# Patient Record
Sex: Female | Born: 1970 | Race: White | Hispanic: No | Marital: Married | Smoking: Former smoker
Health system: Southern US, Community
[De-identification: ages and names within clinical notes are randomized; demographics above are authoritative.]

## PROBLEM LIST (undated history)

## (undated) DIAGNOSIS — Z01419 Encounter for gynecological examination (general) (routine) without abnormal findings: Secondary | ICD-10-CM

## (undated) DIAGNOSIS — N809 Endometriosis, unspecified: Secondary | ICD-10-CM

## (undated) DIAGNOSIS — L578 Other skin changes due to chronic exposure to nonionizing radiation: Secondary | ICD-10-CM

## (undated) DIAGNOSIS — R002 Palpitations: Secondary | ICD-10-CM

## (undated) DIAGNOSIS — G709 Myoneural disorder, unspecified: Secondary | ICD-10-CM

## (undated) DIAGNOSIS — G35 Multiple sclerosis: Secondary | ICD-10-CM

## (undated) DIAGNOSIS — M545 Low back pain, unspecified: Secondary | ICD-10-CM

## (undated) DIAGNOSIS — J302 Other seasonal allergic rhinitis: Secondary | ICD-10-CM

## (undated) DIAGNOSIS — T7840XA Allergy, unspecified, initial encounter: Secondary | ICD-10-CM

## (undated) HISTORY — PX: KNEE ARTHROSCOPY: SUR90

## (undated) HISTORY — DX: Palpitations: R00.2

## (undated) HISTORY — DX: Endometriosis, unspecified: N80.9

## (undated) HISTORY — DX: Multiple sclerosis: G35

## (undated) HISTORY — DX: Myoneural disorder, unspecified: G70.9

## (undated) HISTORY — DX: Low back pain, unspecified: M54.50

## (undated) HISTORY — DX: Other skin changes due to chronic exposure to nonionizing radiation: L57.8

## (undated) HISTORY — DX: Encounter for gynecological examination (general) (routine) without abnormal findings: Z01.419

## (undated) HISTORY — PX: OTHER SURGICAL HISTORY: SHX169

## (undated) HISTORY — DX: Low back pain: M54.5

## (undated) HISTORY — DX: Allergy, unspecified, initial encounter: T78.40XA

---

## 2007-08-23 ENCOUNTER — Ambulatory Visit: Payer: Self-pay | Admitting: Internal Medicine

## 2007-08-23 DIAGNOSIS — J309 Allergic rhinitis, unspecified: Secondary | ICD-10-CM | POA: Insufficient documentation

## 2007-08-26 LAB — CONVERTED CEMR LAB
AST: 21 units/L (ref 0–37)
Alkaline Phosphatase: 28 units/L — ABNORMAL LOW (ref 39–117)
Basophils Relative: 1.5 % — ABNORMAL HIGH (ref 0.0–1.0)
Bilirubin, Direct: 0.1 mg/dL (ref 0.0–0.3)
CO2: 29 meq/L (ref 19–32)
Calcium: 9.1 mg/dL (ref 8.4–10.5)
Chloride: 106 meq/L (ref 96–112)
Creatinine, Ser: 0.7 mg/dL (ref 0.4–1.2)
Eosinophils Relative: 1.9 % (ref 0.0–5.0)
GFR calc non Af Amer: 101 mL/min
Glucose, Bld: 73 mg/dL (ref 70–99)
HCT: 40.7 % (ref 36.0–46.0)
Hemoglobin: 14 g/dL (ref 12.0–15.0)
Lymphocytes Relative: 33.7 % (ref 12.0–46.0)
MCHC: 34.3 g/dL (ref 30.0–36.0)
MCV: 89.5 fL (ref 78.0–100.0)
Platelets: 228 10*3/uL (ref 150–400)
RBC: 4.55 M/uL (ref 3.87–5.11)
RDW: 11.8 % (ref 11.5–14.6)
TSH: 1.98 microintl units/mL (ref 0.35–5.50)
Total Bilirubin: 0.6 mg/dL (ref 0.3–1.2)
Total Protein: 6.7 g/dL (ref 6.0–8.3)
WBC: 6.9 10*3/uL (ref 4.5–10.5)

## 2007-08-30 ENCOUNTER — Ambulatory Visit: Payer: Self-pay | Admitting: Internal Medicine

## 2007-08-30 DIAGNOSIS — R221 Localized swelling, mass and lump, neck: Secondary | ICD-10-CM

## 2007-08-30 DIAGNOSIS — R220 Localized swelling, mass and lump, head: Secondary | ICD-10-CM | POA: Insufficient documentation

## 2008-02-15 ENCOUNTER — Telehealth: Payer: Self-pay | Admitting: Family Medicine

## 2008-02-15 ENCOUNTER — Emergency Department (HOSPITAL_COMMUNITY): Admission: EM | Admit: 2008-02-15 | Discharge: 2008-02-15 | Payer: Self-pay | Admitting: Family Medicine

## 2008-02-20 ENCOUNTER — Ambulatory Visit: Payer: Self-pay | Admitting: Internal Medicine

## 2008-02-20 DIAGNOSIS — R079 Chest pain, unspecified: Secondary | ICD-10-CM | POA: Insufficient documentation

## 2008-11-03 ENCOUNTER — Inpatient Hospital Stay (HOSPITAL_COMMUNITY): Admission: AD | Admit: 2008-11-03 | Discharge: 2008-11-05 | Payer: Self-pay | Admitting: Obstetrics and Gynecology

## 2009-01-05 ENCOUNTER — Ambulatory Visit: Payer: Self-pay | Admitting: Internal Medicine

## 2009-03-03 ENCOUNTER — Ambulatory Visit: Payer: Self-pay | Admitting: Internal Medicine

## 2009-08-04 ENCOUNTER — Ambulatory Visit: Payer: Self-pay | Admitting: Family Medicine

## 2009-08-04 DIAGNOSIS — S335XXA Sprain of ligaments of lumbar spine, initial encounter: Secondary | ICD-10-CM | POA: Insufficient documentation

## 2009-11-23 ENCOUNTER — Ambulatory Visit: Payer: Self-pay | Admitting: Family Medicine

## 2009-11-23 DIAGNOSIS — M545 Low back pain, unspecified: Secondary | ICD-10-CM | POA: Insufficient documentation

## 2009-11-25 ENCOUNTER — Encounter: Admission: RE | Admit: 2009-11-25 | Discharge: 2009-11-25 | Payer: Self-pay | Admitting: Family Medicine

## 2009-11-26 DIAGNOSIS — M5126 Other intervertebral disc displacement, lumbar region: Secondary | ICD-10-CM | POA: Insufficient documentation

## 2009-12-02 ENCOUNTER — Encounter: Payer: Self-pay | Admitting: Family Medicine

## 2010-04-26 ENCOUNTER — Ambulatory Visit: Payer: Self-pay | Admitting: Family Medicine

## 2010-04-28 LAB — CONVERTED CEMR LAB
AST: 21 units/L (ref 0–37)
Albumin: 4.1 g/dL (ref 3.5–5.2)
Alkaline Phosphatase: 31 units/L — ABNORMAL LOW (ref 39–117)
BUN: 12 mg/dL (ref 6–23)
Basophils Absolute: 0 10*3/uL (ref 0.0–0.1)
Basophils Relative: 0.7 % (ref 0.0–3.0)
Bilirubin, Direct: 0.1 mg/dL (ref 0.0–0.3)
CO2: 26 meq/L (ref 19–32)
Chloride: 106 meq/L (ref 96–112)
Creatinine, Ser: 0.6 mg/dL (ref 0.4–1.2)
Eosinophils Absolute: 0.1 10*3/uL (ref 0.0–0.7)
GFR calc non Af Amer: 110.05 mL/min (ref >60)
HDL: 52.4 mg/dL (ref >39.00)
LDL Cholesterol: 128 mg/dL — ABNORMAL HIGH (ref 0–99)
Lymphocytes Relative: 37.1 % (ref 12.0–46.0)
MCV: 90.3 fL (ref 78.0–100.0)
Neutro Abs: 2.6 10*3/uL (ref 1.4–7.7)
Neutrophils Relative %: 54.2 % (ref 43.0–77.0)
Platelets: 186 10*3/uL (ref 150.0–400.0)
RBC: 4.37 M/uL (ref 3.87–5.11)
TSH: 1.8 microintl units/mL (ref 0.35–5.50)
Total Bilirubin: 0.4 mg/dL (ref 0.3–1.2)
WBC: 4.8 10*3/uL (ref 4.5–10.5)

## 2010-05-09 ENCOUNTER — Ambulatory Visit: Payer: Self-pay | Admitting: Family Medicine

## 2010-05-09 DIAGNOSIS — L989 Disorder of the skin and subcutaneous tissue, unspecified: Secondary | ICD-10-CM | POA: Insufficient documentation

## 2010-05-09 DIAGNOSIS — R599 Enlarged lymph nodes, unspecified: Secondary | ICD-10-CM | POA: Insufficient documentation

## 2010-05-10 ENCOUNTER — Encounter
Admission: RE | Admit: 2010-05-10 | Discharge: 2010-05-10 | Payer: Self-pay | Source: Home / Self Care | Attending: Neurosurgery | Admitting: Neurosurgery

## 2010-05-27 ENCOUNTER — Ambulatory Visit: Payer: Self-pay | Admitting: Family Medicine

## 2010-06-06 ENCOUNTER — Ambulatory Visit
Admission: RE | Admit: 2010-06-06 | Discharge: 2010-06-06 | Payer: Self-pay | Source: Home / Self Care | Attending: Family Medicine | Admitting: Family Medicine

## 2010-06-06 DIAGNOSIS — J019 Acute sinusitis, unspecified: Secondary | ICD-10-CM | POA: Insufficient documentation

## 2010-06-28 NOTE — Assessment & Plan Note (Signed)
Summary: follow up on back pain/pt switched drs per dr ok/to est/cjr   Vital Signs:  Patient profile:   40 year old female Weight:      128 pounds BMI:     23.88 BP sitting:   88 / 60  (left arm) Cuff size:   regular  Vitals Entered By: Raechel Ache, RN (November 23, 2009 9:55 AM) CC: F/u LBP, still hurts.   History of Present Illness: Here for continued low back pain. She was here in March after she acutely hurt her back, and we treated her with Vicodin, Flexeril, and Prednisone. This severe pain resolved, but she has had stiffness and some low grade sharp pains ever since. She has dull pains in both anterior thighs as well. No weakness or numbness. Advil helps, and Lidoderm patches help. She borrowed some of these from a friend. She actually went to about 10 sessions of PT at [facility] in [place], but this did not help much.   Allergies: 1)  ! Erythromycin Base (Erythromycin Base) 2)  ! Avelox (Moxifloxacin Hcl)  Past History:  Past Medical History: Reviewed history from 08/23/2007 and no changes required. Allergic rhinitis  Past Surgical History: Reviewed history from 08/23/2007 and no changes required. arthroscopy cervical dysplasia  Review of Systems  The patient denies anorexia, fever, weight loss, weight gain, vision loss, decreased hearing, hoarseness, chest pain, syncope, dyspnea on exertion, peripheral edema, prolonged cough, headaches, hemoptysis, abdominal pain, melena, hematochezia, severe indigestion/heartburn, hematuria, incontinence, genital sores, muscle weakness, suspicious skin lesions, transient blindness, difficulty walking, depression, unusual weight change, abnormal bleeding, enlarged lymph nodes, angioedema, breast masses, and testicular masses.    Physical Exam  General:  Well-developed,well-nourished,in no acute distress; alert,appropriate and cooperative throughout examination Msk:  tender in the right lower back and over the  sciatic notch. No spasm. Full ROM. Negative SLR   Impression & Recommendations:  Problem # 1:  BACK PAIN, LUMBAR (ICD-724.2)  Her updated medication list for this problem includes:    Flexeril 10 Mg Tabs (Cyclobenzaprine hcl) .Marland Kitchen... Three times a day as needed spasm    Vicodin 5-500 Mg Tabs (Hydrocodone-acetaminophen) .Marland Kitchen... 1 q 4 hours as needed pain  Orders: Radiology Referral (Radiology)  Complete Medication List: 1)  Claritin 10 Mg Tabs (Loratadine) .... Once daily 2)  Multivitamins Tabs (Multiple vitamin) .... Once daily 3)  Vitamin C 500 Mg Chew (Ascorbic acid) .... Once daily 4)  Flexeril 10 Mg Tabs (Cyclobenzaprine hcl) .... Three times a day as needed spasm 5)  Vicodin 5-500 Mg Tabs (Hydrocodone-acetaminophen) .Marland Kitchen.. 1 q 4 hours as needed pain 6)  Lidoderm 5 % Ptch (Lidocaine) .... Apply for 12 hours a day as needed pain  Patient Instructions: 1)  Will set up an MRI soon. If this is unrevealing, we may refer her to a chiropractor Prescriptions: LIDODERM 5 % PTCH (LIDOCAINE) apply for 12 hours a day as needed pain  #30 x 5   Entered and Authorized by:   Nelwyn Salisbury MD   Signed by:   Nelwyn Salisbury MD on 11/23/2009   Method used:   [facility] to        [facility]  [address] [phone][facility] (retail[facility]       [address] [place], [place]  [address]       Ph: [phone] or [phone]       Fax: [phone]   RxID:  [id number]  

## 2010-06-28 NOTE — Letter (Signed)
Summary: [facility]  [facility]   Imported By: Maryln Gottron 01/03/2010 15:17:19  _____________________________________________________________________  External Attachment:    Type:   Image     Comment:   External Document

## 2010-06-28 NOTE — Assessment & Plan Note (Signed)
Summary: SW PT/BENT OVER/EXCRUC BACK PAIN/PS   Vital Signs:  Patient profile:   40 year old female Pulse rate:   74 / minute Pulse rhythm:   regular Resp:     12 per minute BP sitting:   102 / 74  (left arm) Cuff size:   regular  Vitals Entered By: Kern Reap CMA Duncan Dull) (August 04, 2009 3:07 PM)  Contraindications/Deferment of Procedures/Staging:    Test/Procedure: Weight Refused    Reason for deferment: patient declined-cannot calculate BMI  CC: back pain Is Patient Diabetic? No Pain Assessment Patient in pain? yes     Location: lower back Intensity: 6 Type: tight and painful   History of Present Illness: Here with her husband for the sudden onset of spasms and extreme pain in the lower back earlt this am. This started when she bent over to pull on a pair of pants. She has had this happen once before some years ago, but normally she has no back problems at all. The pain is centered in the lower back, and the pain does not radiate down the legs. No weakness or numbness in the legs. Using ice packs and Advil.   Allergies: 1)  ! Erythromycin Base (Erythromycin Base) 2)  ! Avelox (Moxifloxacin Hcl)  Past History:  Past Medical History: Reviewed history from 08/23/2007 and no changes required. Allergic rhinitis  Past Surgical History: Reviewed history from 08/23/2007 and no changes required. arthroscopy cervical dysplasia  Review of Systems  The patient denies anorexia, fever, weight loss, weight gain, vision loss, decreased hearing, hoarseness, chest pain, syncope, dyspnea on exertion, peripheral edema, prolonged cough, headaches, hemoptysis, abdominal pain, melena, hematochezia, severe indigestion/heartburn, hematuria, incontinence, genital sores, muscle weakness, suspicious skin lesions, transient blindness, difficulty walking, depression, unusual weight change, abnormal bleeding, enlarged lymph nodes, angioedema, breast masses, and testicular masses.    Physical  Exam  General:  in a lot of pain, seated in a wheelchair. we were able to assist her up onto the exam table Msk:  mildly tender in the lower back, both along the spine and into the paraspinal muscles. There is a lot of spasm, and her ROM is very limited.    Impression & Recommendations:  Problem # 1:  LUMBAR STRAIN, ACUTE (ICD-847.2)  Complete Medication List: 1)  Claritin 10 Mg Tabs (Loratadine) .... Once daily 2)  Multivitamins Tabs (Multiple vitamin) .... Once daily 3)  Vitamin C 500 Mg Chew (Ascorbic acid) .... Once daily 4)  Prednisone (pak) 10 Mg Tabs (Prednisone) .... As directed for 6 days 5)  Flexeril 10 Mg Tabs (Cyclobenzaprine hcl) .... Three times a day as needed spasm 6)  Vicodin 5-500 Mg Tabs (Hydrocodone-acetaminophen) .Marland Kitchen.. 1 q 4 hours as needed pain  Patient Instructions: 1)  Use a steroid dose pack, Flexeril, and Vicodin. Continue ice for another 24 hours, then switch to moist heat. Suggested she do gentle stretches to the lower back. Sleep on your side with some pillows between the legs. recheck if not any better in 2 days. Prescriptions: VICODIN 5-500 MG TABS (HYDROCODONE-ACETAMINOPHEN) 1 q 4 hours as needed pain  #60 x 0   Entered and Authorized by:   Nelwyn Salisbury MD   Signed by:   Nelwyn Salisbury MD on 08/04/2009   Method used:   Print then Give to Patient   RxID:   [id number] FLEXERIL 10 MG TABS (CYCLOBENZAPRINE HCL) three times a day as needed spasm  #60 x 5   Entered and Authorized by:  Nelwyn Salisbury MD   Signed by:   Nelwyn Salisbury MD on 08/04/2009   Method used:   Print then Give to Patient   RxID:   [id number] PREDNISONE (PAK) 10 MG TABS (PREDNISONE) as directed for 6 days  #1 x 0   Entered and Authorized by:   Nelwyn Salisbury MD   Signed by:   Nelwyn Salisbury MD on 08/04/2009   Method used:   Print then Give to Patient   RxID:   [id number]

## 2010-06-30 NOTE — Letter (Signed)
Summary: [facility]  [facility]   Imported By: Maryln Gottron 05/24/2010 11:31:11  _____________________________________________________________________  External Attachment:    Type:   Image     Comment:   External Document

## 2010-06-30 NOTE — Assessment & Plan Note (Signed)
Summary: cough/chest congestion/cjr   Vital Signs:  Patient profile:   40 year old female Weight:      128 pounds O2 Sat:      94 % Temp:     98.3 degrees F Pulse rate:   82 / minute BP sitting:   90 / 60  (left arm) Cuff size:   regular  Vitals Entered By: Pura Spice, RN (June 06, 2010 9:45 AM) CC: cough congestion  thinks now sinus inf. sputum dark color  stuffy nose    History of Present Illness: Here for one week of sinus pressure, PND, HA, and a dry cough. No fever.   Allergies: 1)  ! Erythromycin Base (Erythromycin Base) 2)  ! Avelox (Moxifloxacin Hcl) 3)  ! Biaxin  Past History:  Past Medical History: Reviewed history from 05/27/2010 and no changes required. Allergic rhinitis sees Dr. Marcelle Overlie for GYN exams Low back pain  Review of Systems  The patient denies anorexia, fever, weight loss, weight gain, vision loss, decreased hearing, hoarseness, chest pain, syncope, dyspnea on exertion, peripheral edema, hemoptysis, abdominal pain, melena, hematochezia, severe indigestion/heartburn, hematuria, incontinence, genital sores, muscle weakness, suspicious skin lesions, transient blindness, difficulty walking, depression, unusual weight change, abnormal bleeding, enlarged lymph nodes, angioedema, breast masses, and testicular masses.    Physical Exam  General:  Well-developed,well-nourished,in no acute distress; alert,appropriate and cooperative throughout examination Head:  Normocephalic and atraumatic without obvious abnormalities. No apparent alopecia or balding. Eyes:  No corneal or conjunctival inflammation noted. EOMI. Perrla. Funduscopic exam benign, without hemorrhages, exudates or papilledema. Vision grossly normal. Ears:  External ear exam shows no significant lesions or deformities.  Otoscopic examination reveals clear canals, tympanic membranes are intact bilaterally without bulging, retraction, inflammation or discharge. Hearing is grossly normal  bilaterally. Nose:  External nasal examination shows no deformity or inflammation. Nasal mucosa are pink and moist without lesions or exudates. Mouth:  Oral mucosa and oropharynx without lesions or exudates.  Teeth in good repair. Neck:  No deformities, masses, or tenderness noted. Lungs:  Normal respiratory effort, chest expands symmetrically. Lungs are clear to auscultation, no crackles or wheezes.   Impression & Recommendations:  Problem # 1:  ACUTE SINUSITIS, UNSPECIFIED (ICD-461.9)  Her updated medication list for this problem includes:    Augmentin 875-125 Mg Tabs (Amoxicillin-pot clavulanate) .Marland Kitchen..Marland Kitchen Two times a day  Complete Medication List: 1)  Claritin 10 Mg Tabs (Loratadine) .... Once daily 2)  Augmentin 875-125 Mg Tabs (Amoxicillin-pot clavulanate) .... Two times a day  Patient Instructions: 1)  Please schedule a follow-up appointment as needed .  Prescriptions: AUGMENTIN 875-125 MG TABS (AMOXICILLIN-POT CLAVULANATE) two times a day  #20 x 0   Entered and Authorized by:   Nelwyn Salisbury MD   Signed by:   Nelwyn Salisbury MD on 06/06/2010   Method used:   [facility] to        [facility]  [address] [phone][facility]ail[facility]       [address]       [place], [place]  [address]       Ph: [phone] or [phone]       Fax: [phone]   RxID:   [id number]    Orders Added: 1)  Est. Patient Level IV [56387]

## 2010-06-30 NOTE — Assessment & Plan Note (Signed)
Summary: hard lump near collar bone/cjr   Vital Signs:  Patient profile:   39 year old female Height:      61.5 inches Weight:      131 pounds Temp:     99.0 degrees F Pulse rate:   83 / minute BP sitting:   100 / 68  (left arm)  Vitals Entered By: Jeremy Johann CMA (May 09, 2010 3:15 PM) CC: swelling in neck x3weeks   History of Present Illness: Here to ask about two things that she just noticed a week or so ago. First she felt a tiny non-tender lump in the left side of her neck, and also she saw a tiny dark spot on the left chest area. Neither of these are symptomatic at all. She sees a dermatologist once a year for skin checks.   Current Medications (verified): 1)  Claritin 10 Mg  Tabs (Loratadine) .... Once Daily  Allergies (verified): 1)  ! Erythromycin Base (Erythromycin Base) 2)  ! Avelox (Moxifloxacin Hcl)  Past History:  Past Medical History: Reviewed history from 08/23/2007 and no changes required. Allergic rhinitis  Past Surgical History: Reviewed history from 08/23/2007 and no changes required. arthroscopy cervical dysplasia  Review of Systems  The patient denies anorexia, fever, weight loss, weight gain, vision loss, decreased hearing, hoarseness, chest pain, syncope, dyspnea on exertion, peripheral edema, prolonged cough, headaches, hemoptysis, abdominal pain, melena, hematochezia, severe indigestion/heartburn, hematuria, incontinence, genital sores, muscle weakness, suspicious skin lesions, transient blindness, difficulty walking, depression, unusual weight change, abnormal bleeding, enlarged lymph nodes, angioedema, breast masses, and testicular masses.    Physical Exam  General:  Well-developed,well-nourished,in no acute distress; alert,appropriate and cooperative throughout examination Head:  Normocephalic and atraumatic without obvious abnormalities. No apparent alopecia or balding. Eyes:  No corneal or conjunctival inflammation noted. EOMI.  Perrla. Funduscopic exam benign, without hemorrhages, exudates or papilledema. Vision grossly normal. Ears:  External ear exam shows no significant lesions or deformities.  Otoscopic examination reveals clear canals, tympanic membranes are intact bilaterally without bulging, retraction, inflammation or discharge. Hearing is grossly normal bilaterally. Nose:  External nasal examination shows no deformity or inflammation. Nasal mucosa are pink and moist without lesions or exudates. Mouth:  Oral mucosa and oropharynx without lesions or exudates.  Teeth in good repair. Neck:  single small mobile firm non-tender lump on the left lower neck Skin:  multiple freckles over the body, There is a single tiny  1mm dark papular lesion on the left chest just above the breast which is well marginated Axillary Nodes:  No palpable lymphadenopathy   Impression & Recommendations:  Problem # 1:  LYMPHADENOPATHY (ICD-785.6)  Problem # 2:  SKIN LESION (ICD-709.9)  Complete Medication List: 1)  Claritin 10 Mg Tabs (Loratadine) .... Once daily  Patient Instructions: 1)  reassured her that the left neck node is old, scarred down, and quite benign. Observe only. The lesion on the chest seems to be an early hemangioma, but we will watch this closely for any changes.    Orders Added: 1)  Est. Patient Level IV [54098]

## 2010-06-30 NOTE — Assessment & Plan Note (Signed)
Summary: CPX // RS   Vital Signs:  Patient profile:   40 year old female Height:      62 inches Weight:      127 pounds BMI:     23.31 O2 Sat:      96 % Temp:     97.7 degrees F Pulse rate:   72 / minute BP sitting:   102 / 60  (left arm)  Vitals Entered By: Pura Spice, RN (May 27, 2010 8:49 AM) CC: cpx no complaints   History of Present Illness: 40 yr old female for a cpx. She feels well in general. At our last visit we checked a tiny left [facility] lymph node, and she wants me to check it again. She had a lumbar ESI per Dr. Jeral Fruit, and this was very successful.   Allergies: 1)  ! Erythromycin Base (Erythromycin Base) 2)  ! Avelox (Moxifloxacin Hcl)  Past History:  Past Medical History: Allergic rhinitis sees Dr. Marcelle Overlie for GYN exams Low back pain  Past Surgical History: arthroscopy cervical ablation for dysplasia lumbar ESI 04-2010 per Dr. Jeral Fruit  Past History:  Care Management: Gynecology:  Dr Marcelle Overlie  Family History: Reviewed history from 08/23/2007 and no changes required. mother hyperthyroid---grave's dzs father---CAD/ CABG  Social History: Reviewed history from 08/23/2007 and no changes required. Occupation: Married Regular exercise-yes  Review of Systems  The patient denies anorexia, fever, weight loss, weight gain, vision loss, decreased hearing, hoarseness, chest pain, syncope, dyspnea on exertion, peripheral edema, prolonged cough, headaches, hemoptysis, abdominal pain, melena, hematochezia, severe indigestion/heartburn, hematuria, incontinence, genital sores, muscle weakness, suspicious skin lesions, transient blindness, difficulty walking, depression, unusual weight change, abnormal bleeding, enlarged lymph nodes, angioedema, breast masses, and testicular masses.    Physical Exam  General:  Well-developed,well-nourished,in no acute distress; alert,appropriate and cooperative throughout examination Head:  Normocephalic and atraumatic without  obvious abnormalities. No apparent alopecia or balding. Eyes:  No corneal or conjunctival inflammation noted. EOMI. Perrla. Funduscopic exam benign, without hemorrhages, exudates or papilledema. Vision grossly normal. Ears:  External ear exam shows no significant lesions or deformities.  Otoscopic examination reveals clear canals, tympanic membranes are intact bilaterally without bulging, retraction, inflammation or discharge. Hearing is grossly normal bilaterally. Nose:  External nasal examination shows no deformity or inflammation. Nasal mucosa are pink and moist without lesions or exudates. Mouth:  Oral mucosa and oropharynx without lesions or exudates.  Teeth in good repair. Neck:  No deformities, masses, or tenderness noted. Chest Wall:  No deformities, masses, or tenderness noted. Lungs:  Normal respiratory effort, chest expands symmetrically. Lungs are clear to auscultation, no crackles or wheezes. Heart:  Normal rate and regular rhythm. S1 and S2 normal without gallop, murmur, click, rub or other extra sounds. Abdomen:  Bowel sounds positive,abdomen soft and non-tender without masses, organomegaly or hernias noted. Msk:  No deformity or scoliosis noted of thoracic or lumbar spine.   Pulses:  R and L carotid,radial,femoral,dorsalis pedis and posterior tibial pulses are full and equal bilaterally Extremities:  No clubbing, cyanosis, edema, or deformity noted with normal full range of motion of all joints.   Neurologic:  No cranial nerve deficits noted. Station and gait are normal. Plantar reflexes are down-going bilaterally. DTRs are symmetrical throughout. Sensory, motor and coordinative functions appear intact. Skin:  Intact without suspicious lesions or rashes Cervical Nodes:  No lymphadenopathy noted Axillary Nodes:  No palpable lymphadenopathy Inguinal Nodes:  No significant adenopathy Psych:  Cognition and judgment appear intact. Alert and cooperative with normal  attention span and  concentration. No apparent delusions, illusions, hallucinations   Impression & Recommendations:  Problem # 1:  PREVENTIVE HEALTH CARE (ICD-V70.0)  Complete Medication List: 1)  Claritin 10 Mg Tabs (Loratadine) .... Once daily  Patient Instructions: 1)  I cannot find this neck node today at all.  2)  Please schedule a follow-up appointment as needed .    Orders Added: 1)  Est. Patient 18-39 years [[id number]]

## 2010-07-07 ENCOUNTER — Encounter: Payer: Self-pay | Admitting: Family Medicine

## 2010-07-13 ENCOUNTER — Encounter: Payer: Self-pay | Admitting: Family Medicine

## 2010-07-13 ENCOUNTER — Ambulatory Visit (INDEPENDENT_AMBULATORY_CARE_PROVIDER_SITE_OTHER): Payer: BC Managed Care – PPO | Admitting: Family Medicine

## 2010-07-13 VITALS — BP 82/60 | HR 109 | Temp 98.7°F | Ht 62.5 in | Wt 127.0 lb

## 2010-07-13 DIAGNOSIS — R599 Enlarged lymph nodes, unspecified: Secondary | ICD-10-CM

## 2010-07-13 DIAGNOSIS — R590 Localized enlarged lymph nodes: Secondary | ICD-10-CM

## 2010-07-13 NOTE — Progress Notes (Signed)
  Subjective:    Patient ID: [name], female    DOB: 07/04/1970, 40 y.o.   MRN: [id number]  HPI Here to re-evaluate several tiny lymph nodes in the left anterior neck that have been present for several months. We first evaluated these about 2 months ago, and we felt these were benign. She says they have not grown any larger but she worries about them all the time. Sometimes she thinks she can feel a slight swelling sensation or an itching sensation in this area, but she cannot be sure. She wonders if these feelings are "all in my head". No systemic symptoms, no weight changes, etc.    Review of Systems  Constitutional: Negative.   HENT: Negative.   Respiratory: Negative.   Cardiovascular: Negative.   Gastrointestinal: Negative.        Objective:   Physical Exam  Constitutional: She appears well-developed and well-nourished.  HENT:  Head: Normocephalic and atraumatic.  Right Ear: External ear normal.  Left Ear: External ear normal.  Nose: Nose normal.  Mouth/Throat: Oropharynx is clear and moist. No oropharyngeal exudate.  Eyes: Conjunctivae are normal. Right eye exhibits no discharge. Left eye exhibits no discharge.  Neck: Normal range of motion. Neck supple. No thyromegaly present.       She has 2 tiny mobile non-tender nodes in the left anterior neck along the sternocleidomastoid chain. These have not changed at all from my last exam   Cardiovascular: Normal rate, regular rhythm, normal heart sounds and intact distal pulses.  Exam reveals no gallop and no friction rub.   No murmur heard. Pulmonary/Chest: Effort normal and breath sounds normal. No respiratory distress. She has no wheezes. She has no rales. She exhibits no tenderness.  Lymphadenopathy:    She has cervical adenopathy.          Assessment & Plan:  Benign cervical adenopathy. Once again I reassured her that these do not require any further workup. She will follow up if there are any changes

## 2010-09-05 LAB — CORD BLOOD PRIVATE-SAMPLE DRAW

## 2010-09-05 LAB — CBC
HCT: 31.2 % — ABNORMAL LOW (ref 36.0–46.0)
Hemoglobin: 11.2 g/dL — ABNORMAL LOW (ref 12.0–15.0)
MCHC: 35.3 g/dL (ref 30.0–36.0)
Platelets: 118 10*3/uL — ABNORMAL LOW (ref 150–400)
Platelets: 119 10*3/uL — ABNORMAL LOW (ref 150–400)
RBC: 3.2 MIL/uL — ABNORMAL LOW (ref 3.87–5.11)
RDW: 13.8 % (ref 11.5–15.5)
WBC: 11.1 10*3/uL — ABNORMAL HIGH (ref 4.0–10.5)

## 2010-09-05 LAB — RPR: RPR Ser Ql: NONREACTIVE

## 2010-09-27 ENCOUNTER — Ambulatory Visit (INDEPENDENT_AMBULATORY_CARE_PROVIDER_SITE_OTHER): Payer: BC Managed Care – PPO | Admitting: Family Medicine

## 2010-09-27 ENCOUNTER — Encounter: Payer: Self-pay | Admitting: Family Medicine

## 2010-09-27 VITALS — BP 84/58 | HR 90 | Temp 98.5°F | Resp 12 | Wt 124.3 lb

## 2010-09-27 DIAGNOSIS — R002 Palpitations: Secondary | ICD-10-CM

## 2010-09-27 DIAGNOSIS — Z136 Encounter for screening for cardiovascular disorders: Secondary | ICD-10-CM

## 2010-09-27 LAB — BASIC METABOLIC PANEL
CO2: 27 mEq/L (ref 19–32)
Calcium: 9.1 mg/dL (ref 8.4–10.5)
GFR: 104.16 mL/min (ref >60.00)
Sodium: 140 mEq/L (ref 135–145)

## 2010-09-27 LAB — POCT URINALYSIS DIPSTICK
Bilirubin, UA: NEGATIVE
Glucose, UA: NEGATIVE
Ketones, UA: NEGATIVE
Spec Grav, UA: 1.02
Urobilinogen, UA: 0.2

## 2010-09-27 LAB — CBC WITH DIFFERENTIAL/PLATELET
Eosinophils Relative: 1.3 % (ref 0.0–5.0)
Hemoglobin: 13.1 g/dL (ref 12.0–15.0)
Lymphocytes Relative: 28.5 % (ref 12.0–46.0)
Lymphs Abs: 1.8 10*3/uL (ref 0.7–4.0)
MCV: 90.6 fl (ref 78.0–100.0)
Monocytes Absolute: 0.4 10*3/uL (ref 0.1–1.0)
Neutro Abs: 3.9 10*3/uL (ref 1.4–7.7)
Platelets: 162 10*3/uL (ref 150.0–400.0)
RBC: 4.19 Mil/uL (ref 3.87–5.11)
WBC: 6.2 10*3/uL (ref 4.5–10.5)

## 2010-09-27 LAB — HEPATIC FUNCTION PANEL
AST: 16 U/L (ref 0–37)
Albumin: 3.9 g/dL (ref 3.5–5.2)
Total Protein: 6.6 g/dL (ref 6.0–8.3)

## 2010-09-27 NOTE — Progress Notes (Signed)
  Subjective:    Patient ID: [name], female    DOB: 03-14-1971, 40 y.o.   MRN: [id number]  HPI Here for about 6 months of intermittent shakiness, heart racing, feeling weak, and feeling nauseated. No SOB but she does feel some chest tightness at times. No heartburn or reflux symptoms. No fevers or weight changes. She does not feel particularly stressed or anxious. She sleeps well. She typically drinks 2 cups of caffeinated coffee every day.    Review of Systems  Constitutional: Positive for fatigue. Negative for fever and unexpected weight change.  HENT: Negative.   Respiratory: Positive for chest tightness. Negative for cough, shortness of breath and wheezing.   Cardiovascular: Negative.   Gastrointestinal: Negative.   Genitourinary: Negative.   Neurological: Negative.   Psychiatric/Behavioral: Negative.        Objective:   Physical Exam  Constitutional: She is oriented to person, place, and time. She appears well-developed and well-nourished.  Neck: Normal range of motion. Neck supple. No thyromegaly present.  Cardiovascular: Normal rate, regular rhythm, normal heart sounds and intact distal pulses.  Exam reveals no gallop and no friction rub.   No murmur heard.      EKG normal   Pulmonary/Chest: Effort normal and breath sounds normal. No respiratory distress. She has no wheezes. She has no rales. She exhibits no tenderness.  Neurological: She is alert and oriented to person, place, and time. No cranial nerve deficit. She exhibits normal muscle tone. Coordination normal.  Psychiatric: She has a normal mood and affect. Her behavior is normal.          Assessment & Plan:  These symptoms are most likely related to anxiety, and we discussed options for treating them, including beta blockers or anxiety meds. She chose to avoid meds if possible. I did advise her to stop all caffeine use. Get labs today

## 2010-09-29 NOTE — Progress Notes (Signed)
Pt. Informed.

## 2010-12-07 ENCOUNTER — Ambulatory Visit: Payer: BC Managed Care – PPO | Admitting: Internal Medicine

## 2010-12-07 ENCOUNTER — Encounter (HOSPITAL_BASED_OUTPATIENT_CLINIC_OR_DEPARTMENT_OTHER): Payer: Self-pay | Admitting: Emergency Medicine

## 2010-12-07 ENCOUNTER — Emergency Department (HOSPITAL_BASED_OUTPATIENT_CLINIC_OR_DEPARTMENT_OTHER)
Admission: EM | Admit: 2010-12-07 | Discharge: 2010-12-07 | Disposition: A | Discharge status: Home / Self Care | Payer: BC Managed Care – PPO | Attending: Emergency Medicine | Admitting: Emergency Medicine

## 2010-12-07 ENCOUNTER — Telehealth: Payer: Self-pay | Admitting: Family Medicine

## 2010-12-07 DIAGNOSIS — Z9109 Other allergy status, other than to drugs and biological substances: Secondary | ICD-10-CM | POA: Insufficient documentation

## 2010-12-07 DIAGNOSIS — I2510 Atherosclerotic heart disease of native coronary artery without angina pectoris: Secondary | ICD-10-CM | POA: Insufficient documentation

## 2010-12-07 DIAGNOSIS — R109 Unspecified abdominal pain: Secondary | ICD-10-CM | POA: Insufficient documentation

## 2010-12-07 DIAGNOSIS — R197 Diarrhea, unspecified: Secondary | ICD-10-CM

## 2010-12-07 HISTORY — DX: Other seasonal allergic rhinitis: J30.2

## 2010-12-07 LAB — URINALYSIS, ROUTINE W REFLEX MICROSCOPIC
Bilirubin Urine: NEGATIVE
Leukocytes, UA: NEGATIVE
Nitrite: NEGATIVE
Protein, ur: NEGATIVE mg/dL
Specific Gravity, Urine: 1.006 (ref 1.005–1.030)

## 2010-12-07 LAB — BASIC METABOLIC PANEL
BUN: 11 mg/dL (ref 6–23)
CO2: 25 mEq/L (ref 19–32)
Chloride: 104 mEq/L (ref 96–112)
GFR calc Af Amer: >60 mL/min (ref >60)
Potassium: 4 mEq/L (ref 3.5–5.1)
Sodium: 139 mEq/L (ref 135–145)

## 2010-12-07 LAB — DIFFERENTIAL
Eosinophils Absolute: 0.1 10*3/uL (ref 0.0–0.7)
Eosinophils Relative: 1 % (ref 0–5)
Lymphocytes Relative: 8 % — ABNORMAL LOW (ref 12–46)
Lymphs Abs: 0.8 10*3/uL (ref 0.7–4.0)
Monocytes Absolute: 0.7 10*3/uL (ref 0.1–1.0)
Neutro Abs: 7.9 10*3/uL — ABNORMAL HIGH (ref 1.7–7.7)
Neutrophils Relative %: 83 % — ABNORMAL HIGH (ref 43–77)

## 2010-12-07 LAB — CBC
MCH: 30.1 pg (ref 26.0–34.0)
MCHC: 35.3 g/dL (ref 30.0–36.0)
Platelets: 202 10*3/uL (ref 150–400)
RDW: 12.2 % (ref 11.5–15.5)

## 2010-12-07 LAB — PREGNANCY, URINE: Preg Test, Ur: NEGATIVE

## 2010-12-07 MED ORDER — SODIUM CHLORIDE 0.9 % IV SOLN
Freq: Once | INTRAVENOUS | Status: AC
  Administered 2010-12-07: 11:00:00 via INTRAVENOUS

## 2010-12-07 MED ORDER — DIPHENOXYLATE-ATROPINE 2.5-0.025 MG PO TABS: 2.0000 | ORAL_TABLET | Freq: Four times a day (QID) | ORAL | Status: AC | PRN

## 2010-12-07 MED ORDER — DIPHENOXYLATE-ATROPINE 2.5-0.025 MG PO TABS
2.0000 | ORAL_TABLET | Freq: Once | ORAL | Status: AC
  Administered 2010-12-07: 2 via ORAL
  Filled 2010-12-07: qty 2

## 2010-12-07 NOTE — ED Notes (Signed)
Patient is resting comfortably. 

## 2010-12-07 NOTE — Telephone Encounter (Signed)
Offered pt an appt at 12 noon.   She declined.

## 2010-12-07 NOTE — Telephone Encounter (Addendum)
Pt went to [facility] on Sunday for Sinus Inf. Pt was given some Amoxicillin 875 mg bid. This antibiotic has made pt sick on stomach. Pt is also sweating,nauseous,dizzy. Pt req to worked in to see any doctor this a.m. Pls call.

## 2010-12-07 NOTE — ED Notes (Signed)
Alert and oriented, color good at present  And skin warm and dry. Abrasion  To lower lip from fall last pm.reports taking antibiotics for sinus infection  Then starting with diarrhea and abd cramps  Then became dizzy  Diaphoretic and weak

## 2010-12-07 NOTE — ED Provider Notes (Signed)
History     Chief Complaint  Patient presents with  . Abdominal Cramping   HPI Comments: Pt had been to see a doctor on Sunday, 3 days ago, and was prescribed amoxicillin 875 mg twice a day for a sinus infection.  Her sinuses are feeling better. Last night she had the onset of diarrhea with stools like water.  She had dizziness and nausea.  She had an episode where she felt faint and fell, bruising her lower lip.  This morning she still had diarrhea.  She therefore sought evaluation.  Patient is a 40 y.o. female presenting with diarrhea.  Diarrhea The primary symptoms include diarrhea. The illness began yesterday. The onset was sudden.    Past Medical History  Diagnosis Date  . Allergy   . Low back pain   . Sinus infection   . Seasonal allergies     Past Surgical History  Procedure Date  . Dysplasia     cervical ablation  . Lumbar esi     Dr Jeral Fruit    Family History  Problem Relation Age of Onset  . Hyperthyroidism Mother     Graves   . Coronary artery disease Father     History  Substance Use Topics  . Smoking status: Former Games developer  . Smokeless tobacco: Not on file  . Alcohol Use: 0.6 oz/week    1 Glasses of wine per week    OB History    Grav Para Term Preterm Abortions TAB SAB Ect Mult Living                  Review of Systems  HENT: Positive for congestion and rhinorrhea.   Eyes: Negative.   Respiratory: Negative.   Cardiovascular: Negative.   Gastrointestinal: Positive for diarrhea.  Musculoskeletal: Negative.   Neurological: Positive for dizziness and weakness. Negative for tremors.  Psychiatric/Behavioral: Negative.     Physical Exam  BP 100/66  Pulse 87  Temp(Src) 98.7 F (37.1 C) (Oral)  Resp 18  SpO2 100%  Physical Exam  Constitutional: She is oriented to person, place, and time. She appears well-developed and well-nourished. No distress.  HENT:  Head: Normocephalic and atraumatic.  Right Ear: External ear normal.  Left Ear:  External ear normal.       She has a bruise on the left side of her lower lip.  Eyes: Conjunctivae and EOM are normal. Pupils are equal, round, and reactive to light.  Neck: Normal range of motion. Neck supple.  Cardiovascular: Normal rate and regular rhythm.   Pulmonary/Chest: Effort normal and breath sounds normal.  Abdominal: Soft. Bowel sounds are normal.  Neurological: She is alert and oriented to person, place, and time.  Skin: Skin is warm and dry.  Psychiatric: She has a normal mood and affect. Her behavior is normal.    ED Course  Procedures  MDM  Feeling better after rehydration with IV fluids.  She will D/C her amoxicillin.  Rx Lomotil 2 tabs q4h prn diarrhea.  F/U prn with Dr. Gershon Crane, her PCP.       Carleene Cooper III, MD 12/07/10 [phone]

## 2010-12-07 NOTE — ED Notes (Signed)
Pt states she does not want to be discharged home on same abx she has been taking since it caused stomach upset.

## 2010-12-07 NOTE — ED Notes (Addendum)
Reports being on amoxicillin 875mg   For a sinus infection.  Last pm had abd cramps w/diarrhea,became dizzy and diaphoretic...fell and has an abrasion to lower lip.

## 2011-02-27 LAB — POCT URINALYSIS DIP (DEVICE)
Bilirubin Urine: NEGATIVE
Glucose, UA: NEGATIVE
Ketones, ur: 15 — AB
Protein, ur: NEGATIVE
Urobilinogen, UA: 0.2

## 2011-02-27 LAB — POCT I-STAT, CHEM 8
Creatinine, Ser: 0.9
Glucose, Bld: 87
HCT: 44
Hemoglobin: 15
Potassium: 4.1

## 2011-04-26 ENCOUNTER — Emergency Department (HOSPITAL_COMMUNITY): Payer: BC Managed Care – PPO

## 2011-04-26 ENCOUNTER — Emergency Department (HOSPITAL_COMMUNITY)
Admission: EM | Admit: 2011-04-26 | Discharge: 2011-04-26 | Disposition: A | Discharge status: Home / Self Care | Payer: BC Managed Care – PPO | Attending: Emergency Medicine | Admitting: Emergency Medicine

## 2011-04-26 DIAGNOSIS — R002 Palpitations: Secondary | ICD-10-CM

## 2011-04-26 DIAGNOSIS — R110 Nausea: Secondary | ICD-10-CM | POA: Insufficient documentation

## 2011-04-26 DIAGNOSIS — R232 Flushing: Secondary | ICD-10-CM | POA: Insufficient documentation

## 2011-04-26 DIAGNOSIS — R079 Chest pain, unspecified: Secondary | ICD-10-CM | POA: Insufficient documentation

## 2011-04-26 NOTE — ED Notes (Signed)
ekg given to [name],.

## 2011-04-26 NOTE — ED Provider Notes (Signed)
Medical screening examination/treatment/procedure(s) were conducted as a shared visit with non-physician practitioner(s) and myself.  I personally evaluated the patient during the encounter   Joya Gaskins, MD 04/26/11 [phone]

## 2011-04-26 NOTE — ED Provider Notes (Signed)
History     CSN: [id number] Arrival date & time: 04/26/2011  1:16 PM   First MD Initiated Contact with Patient 04/26/11 1325      Chief Complaint  Patient presents with  . Chest Pain    (Consider location/radiation/quality/duration/timing/severity/associated sxs/prior treatment) HPI   Patient whostates history of seasonal allergies that she only takes as needed Claritin with no other known medical problems and takes no other medicines on regular basis presents to emergency department complaining of a multiple month history of intermittent palpitations with associated flushed feeling and nausea but also a two-week history of gradual onset but constant and persistent substernal chest discomfort that she describes as a "faint pressure or bubble like sensation." Patient states she saw her primary care physician, Dr. Clent Ridges, in May with concerns of palpitations and dizziness and had an EKG which was normal and some "basic blood work." Patient states that while she was at her desk today she had acute onset palpitations, sensation of heart racing and began to feel nauseous and flushed and states symptoms were more severe than usual and therefore called EMS. Patient was given 4 baby aspirin prior to arrival. Patient states while waiting for evaluation she has had waxing and waning sensation of heart racing. Patient states she's currently having the constant dull discomfort in her chest that she's been having for 2 weeks. Patient denies fevers, chills, cough, weight loss, night sweats, skin changes, hemoptysis, abdominal pain, vomiting, diarrhea, lower extremity swelling, history of PE or DVT, exogenous estrogen use.  Past Medical History  Diagnosis Date  . Allergy   . Low back pain   . Sinus infection   . Seasonal allergies     Past Surgical History  Procedure Date  . Dysplasia     cervical ablation  . Lumbar esi     Dr Jeral Fruit    Family History  Problem Relation Age of Onset  .  Hyperthyroidism Mother     Graves   . Coronary artery disease Father     History  Substance Use Topics  . Smoking status: Former Games developer  . Smokeless tobacco: Not on file  . Alcohol Use: 0.6 oz/week    1 Glasses of wine per week    OB History    Grav Para Term Preterm Abortions TAB SAB Ect Mult Living                  Review of Systems  All other systems reviewed and are negative.    Allergies  Clarithromycin; Erythromycin base; and Moxifloxacin  Home Medications   Current Outpatient Rx  Name Route Sig Dispense Refill  . AMOXICILLIN 875 MG PO TABS Oral Take 875 mg by mouth 2 (two) times daily.      Marland Kitchen LORATADINE 10 MG PO TABS Oral Take 10 mg by mouth daily.      . MULTIVITAMINS PO CAPS Oral Take 1 capsule by mouth daily.        BP 110/77  Pulse 81  Temp(Src) 98.2 F (36.8 C) (Oral)  Resp 18  SpO2 100%  Physical Exam  Nursing note and vitals reviewed. Constitutional: She is oriented to person, place, and time. She appears well-developed and well-nourished. No distress.  HENT:  Head: Normocephalic and atraumatic.  Eyes: Conjunctivae and EOM are normal. Pupils are equal, round, and reactive to light.  Neck: Normal range of motion. Neck supple. No thyromegaly present.  Cardiovascular: Normal rate, regular rhythm, normal heart sounds and intact distal pulses.  Exam  reveals no gallop and no friction rub.   No murmur heard. Pulmonary/Chest: Effort normal and breath sounds normal. No respiratory distress. She has no wheezes. She has no rales. She exhibits no tenderness.  Abdominal: Bowel sounds are normal. She exhibits no distension and no mass. There is no tenderness. There is no rebound and no guarding.  Musculoskeletal: Normal range of motion. She exhibits no edema and no tenderness.  Neurological: She is alert and oriented to person, place, and time.  Skin: Skin is warm and dry. No rash noted. She is not diaphoretic. No erythema.  Psychiatric: She has a normal mood  and affect.    ED Course  Procedures (including critical care time)   Date: 04/26/2011  Rate: 72  Rhythm: normal sinus rhythm  QRS Axis: normal  Intervals: normal  ST/T Wave abnormalities: normal  Conduction Disutrbances:none  Narrative Interpretation:   Old EKG Reviewed: none available    Labs Reviewed - No data to display Dg Chest 2 View  04/26/2011  *RADIOLOGY REPORT*  Clinical Data: Chest pain, dizziness  CHEST - 2 VIEW  Comparison: Chest x-ray of 02/15/2008  Findings: The lungs remain clear and slightly hyperaerated. Mediastinal contours appear normal.  The heart is within normal limits in size.  No bony abnormality is seen.  IMPRESSION: There is slight hyperaeration.  No active lung disease.  Original Report Authenticated By: Juline Patch, M.D.     1. Palpitation   2. Chest pain       MDM  Patient is low risk for PE and PERC negative as well as low risk ACS with normal EKG and chest xray with a 2 week hx of constant dull discomfort. No thyroidmegaly. Sitting comfortably in bed. VSS. NAD.         Jenness [place], [place] 04/26/11 1541

## 2011-04-26 NOTE — ED Notes (Signed)
Skin w/d, resp e/u. Pt states she was sitting at desk when she felt a wave of discomfort. Has been feeling this for several weeks. Nothing noted to make worse or better.

## 2011-04-26 NOTE — ED Notes (Signed)
To ed for eval of cp. Via ems. Asa 324mg  po pta. Iv pta.

## 2011-04-26 NOTE — ED Provider Notes (Signed)
Pt well appearing Suspicion for ACS/PE is low EKG reviewed Can f/u as outpatient for palpitations Tsh/free t3/t4 ordered  Joya Gaskins, MD 04/26/11 1528

## 2011-04-28 ENCOUNTER — Encounter: Payer: Self-pay | Admitting: Family Medicine

## 2011-04-28 ENCOUNTER — Ambulatory Visit (INDEPENDENT_AMBULATORY_CARE_PROVIDER_SITE_OTHER): Payer: BC Managed Care – PPO | Admitting: Family Medicine

## 2011-04-28 VITALS — BP 108/70 | HR 109 | Temp 98.5°F | Wt 122.0 lb

## 2011-04-28 DIAGNOSIS — R000 Tachycardia, unspecified: Secondary | ICD-10-CM

## 2011-04-28 NOTE — Progress Notes (Signed)
  Subjective:    Patient ID: [name], female    DOB: [birth year], 40 y.o.   MRN: [id number]  HPI Here to follow up on palpitations and tachycardia. I saw her in May for these symptoms, and basic labs and an EKG that day were normal. We felt then that these may have been coming from anxiety. She has continued to have these symptoms frequently ever since. She feels fine at times but then feels her heart start to pound and race, she feels nervous and shaky, she gets nauseated, and she feels pressure in her chest. No cough or SOB or sweats. Her weight has been stable, and today she is 2 lbs lighter then she was here in May. No diarrhea or fever. She is on no OTC medications. She uses 1-2 caffeinated beverages a day. She was in the ER with a bout of this on 04-26-11, and her workup was totally normal. This included labs with a full thyroid panel, an EKG, and a CXR. Again today I discussed the possibility that anxiety could be playing a role, and she admits that it may be.    Review of Systems  Constitutional: Negative.   HENT: Negative.   Eyes: Negative.   Respiratory: Positive for chest tightness. Negative for cough, shortness of breath and wheezing.   Cardiovascular: Positive for palpitations.       Objective:   Physical Exam  Constitutional: She appears well-developed and well-nourished.  Neck: Neck supple. No thyromegaly present.  Cardiovascular: Regular rhythm, normal heart sounds and intact distal pulses.  Exam reveals no gallop and no friction rub.   No murmur heard.      Slightly rapid rate   Pulmonary/Chest: Effort normal and breath sounds normal. No respiratory distress. She has no wheezes. She has no rales. She exhibits no tenderness.  Lymphadenopathy:    She has no cervical adenopathy.  Psychiatric: She has a normal mood and affect. Her behavior is normal. Thought content normal.          Assessment & Plan:  It is not clear what the etiology of this may be, but I still  think anxiety is an underlying issue. I think her taking a beta blocker would be very helpful for her, but we will defer this until our workup is more complete. I will set her up for an ECHO and a 48 hour Holter monitor soon. Get a 24 hour urine for metanephrines.

## 2011-05-02 ENCOUNTER — Encounter (INDEPENDENT_AMBULATORY_CARE_PROVIDER_SITE_OTHER): Payer: BC Managed Care – PPO

## 2011-05-02 ENCOUNTER — Ambulatory Visit (HOSPITAL_COMMUNITY): Payer: BC Managed Care – PPO | Attending: Cardiology | Admitting: Radiology

## 2011-05-02 ENCOUNTER — Encounter: Payer: Self-pay | Admitting: Family Medicine

## 2011-05-02 ENCOUNTER — Ambulatory Visit (INDEPENDENT_AMBULATORY_CARE_PROVIDER_SITE_OTHER): Payer: BC Managed Care – PPO | Admitting: Family Medicine

## 2011-05-02 VITALS — BP 100/66 | HR 101 | Temp 98.4°F | Wt 123.0 lb

## 2011-05-02 DIAGNOSIS — R000 Tachycardia, unspecified: Secondary | ICD-10-CM | POA: Insufficient documentation

## 2011-05-02 DIAGNOSIS — R072 Precordial pain: Secondary | ICD-10-CM

## 2011-05-02 DIAGNOSIS — I079 Rheumatic tricuspid valve disease, unspecified: Secondary | ICD-10-CM | POA: Insufficient documentation

## 2011-05-02 DIAGNOSIS — I471 Supraventricular tachycardia: Secondary | ICD-10-CM

## 2011-05-02 DIAGNOSIS — R079 Chest pain, unspecified: Secondary | ICD-10-CM

## 2011-05-02 NOTE — Progress Notes (Signed)
  Subjective:    Patient ID: [name], female    DOB: 1970/07/18, 40 y.o.   MRN: [id number]  HPI Here to follow up on chest discomfort and heart racing. We saw her on 04-28-11 and got some labs. Her thyroid panel was normal, and the 24 hour urine is still pending. She gets the ECHO later today, and I assume she will be fitted with the Holter monitor then as well. She continues to have occasional episodes as before, but these are manageable.    Review of Systems  Constitutional: Negative.   Respiratory: Negative.   Cardiovascular: Positive for chest pain and palpitations. Negative for leg swelling.  Neurological: Negative.        Objective:   Physical Exam  Constitutional: She is oriented to person, place, and time. She appears well-developed and well-nourished.  Cardiovascular: Normal rate, regular rhythm, normal heart sounds and intact distal pulses.   Pulmonary/Chest: Effort normal.  Neurological: She is alert and oriented to person, place, and time. No cranial nerve deficit.          Assessment & Plan:  We will see what her results show [place]. Another possible explanation could be hypoglycemia, so we may want to set up a GTT at some point

## 2011-05-04 LAB — METANEPHRINES, URINE, 24 HOUR: Metanephrines, Ur: 92 mcg/24 h (ref 36–190)

## 2011-05-04 NOTE — Progress Notes (Signed)
Quick Note:  Spoke with pt ______ 

## 2011-05-05 NOTE — Progress Notes (Signed)
Quick Note:  Spoke with pt ______ 

## 2011-05-09 NOTE — Progress Notes (Signed)
Quick Note:  Spoke with pt ______ 

## 2011-06-05 ENCOUNTER — Ambulatory Visit (INDEPENDENT_AMBULATORY_CARE_PROVIDER_SITE_OTHER): Payer: BC Managed Care – PPO | Admitting: Family Medicine

## 2011-06-05 ENCOUNTER — Encounter: Payer: Self-pay | Admitting: Family Medicine

## 2011-06-05 VITALS — BP 98/66 | HR 90 | Temp 98.6°F | Wt 126.0 lb

## 2011-06-05 DIAGNOSIS — R079 Chest pain, unspecified: Secondary | ICD-10-CM

## 2011-06-05 DIAGNOSIS — R002 Palpitations: Secondary | ICD-10-CM

## 2011-06-05 NOTE — Progress Notes (Signed)
  Subjective:    Patient ID: Avon Gully, female    DOB: 22-Apr-1971, 41 y.o.   MRN: [id number]  HPI Here to follow up on occasional chest discomfort and palpitations. She has had routine labs, a thyroid panel, a 24 hour urine for metanephrines, a CXR, an ECHO, a Holter monitor. All these were normal. She still has these symptoms but they are less frequent.    Review of Systems  Constitutional: Negative.   Respiratory: Negative.   Cardiovascular: Positive for chest pain and palpitations. Negative for leg swelling.  Gastrointestinal: Negative.        Objective:   Physical Exam  Constitutional: She appears well-developed and well-nourished.  Neck: No thyromegaly present.  Cardiovascular: Normal rate, normal heart sounds and intact distal pulses.   Pulmonary/Chest: Effort normal and breath sounds normal.  Lymphadenopathy:    She has no cervical adenopathy.          Assessment & Plan:  We have ruled out a lot of significant possibilities, so I reassured her today. She has reduced her caffeine usage. She may have some GERD so she will try taking Prilosec OTC daily. Recheck prn

## 2012-07-24 ENCOUNTER — Ambulatory Visit (INDEPENDENT_AMBULATORY_CARE_PROVIDER_SITE_OTHER): Payer: BC Managed Care – PPO | Admitting: Family Medicine

## 2012-07-24 ENCOUNTER — Encounter: Payer: Self-pay | Admitting: Family Medicine

## 2012-07-24 VITALS — BP 112/70 | HR 92 | Temp 98.4°F | Wt 128.0 lb

## 2012-07-24 DIAGNOSIS — K1120 Sialoadenitis, unspecified: Secondary | ICD-10-CM

## 2012-07-24 MED ORDER — CEPHALEXIN 500 MG PO CAPS: 500.0000 mg | ORAL_CAPSULE | Freq: Three times a day (TID) | ORAL | Status: AC

## 2012-07-24 NOTE — Progress Notes (Signed)
  Subjective:    Patient ID: [name], female    DOB: Nov 23, 1970, 42 y.o.   MRN: [id number]  HPI Here for 4 days of intermittent chills, puffiness around the face, and mild pain around the left cheek and left ear. No fevers or sinus drainage or ST. No cough. Using Advil. She saw her dentist yesterday and he said her teeth were fine.    Review of Systems  Constitutional: Negative.   HENT: Positive for ear pain and facial swelling. Negative for hearing loss, congestion, rhinorrhea, sneezing, neck pain, neck stiffness, postnasal drip, sinus pressure and tinnitus.   Eyes: Negative.   Respiratory: Negative.        Objective:   Physical Exam  Constitutional: She appears well-developed and well-nourished. No distress.  HENT:  Right Ear: External ear normal.  Left Ear: External ear normal.  Nose: Nose normal.  Mouth/Throat: Oropharynx is clear and moist. No oropharyngeal exudate.  Mildly tender over the left cheek area, no visible swelling. Her TMJs are both normal and nontender   Eyes: Conjunctivae and EOM are normal. Pupils are equal, round, and reactive to light.  Neck: Neck supple. No thyromegaly present.  Lymphadenopathy:    She has no cervical adenopathy.          Assessment & Plan:  Use warm compresses prn.

## 2012-08-12 ENCOUNTER — Other Ambulatory Visit (INDEPENDENT_AMBULATORY_CARE_PROVIDER_SITE_OTHER): Payer: BC Managed Care – PPO

## 2012-08-12 DIAGNOSIS — Z0000 Encounter for general adult medical examination without abnormal findings: Secondary | ICD-10-CM

## 2012-08-12 LAB — CBC WITH DIFFERENTIAL/PLATELET
Basophils Relative: 0.9 % (ref 0.0–3.0)
Eosinophils Relative: 1.3 % (ref 0.0–5.0)
Lymphocytes Relative: 34.3 % (ref 12.0–46.0)
MCV: 88.5 fl (ref 78.0–100.0)
Monocytes Relative: 6.6 % (ref 3.0–12.0)
Neutrophils Relative %: 56.9 % (ref 43.0–77.0)
RBC: 4.42 Mil/uL (ref 3.87–5.11)
WBC: 5.3 10*3/uL (ref 4.5–10.5)

## 2012-08-12 LAB — LIPID PANEL
LDL Cholesterol: 109 mg/dL — ABNORMAL HIGH (ref 0–99)
VLDL: 20.2 mg/dL (ref 0.0–40.0)

## 2012-08-12 LAB — POCT URINALYSIS DIPSTICK
Leukocytes, UA: NEGATIVE
Protein, UA: NEGATIVE
Urobilinogen, UA: 0.2
pH, UA: 5

## 2012-08-12 LAB — BASIC METABOLIC PANEL
BUN: 13 mg/dL (ref 6–23)
CO2: 24 mEq/L (ref 19–32)
Chloride: 103 mEq/L (ref 96–112)
Glucose, Bld: 96 mg/dL (ref 70–99)
Potassium: 3.7 mEq/L (ref 3.5–5.1)

## 2012-08-12 LAB — HEPATIC FUNCTION PANEL
AST: 14 U/L (ref 0–37)
Albumin: 4 g/dL (ref 3.5–5.2)
Total Protein: 6.8 g/dL (ref 6.0–8.3)

## 2012-08-12 LAB — TSH: TSH: 0.77 u[IU]/mL (ref 0.35–5.50)

## 2012-08-13 ENCOUNTER — Other Ambulatory Visit: Payer: BC Managed Care – PPO

## 2012-08-13 NOTE — Progress Notes (Signed)
Quick Note:  Pt has appointment on 08/20/12 will go over then. ______

## 2012-08-20 ENCOUNTER — Encounter: Payer: Self-pay | Admitting: Family Medicine

## 2012-08-20 ENCOUNTER — Ambulatory Visit (INDEPENDENT_AMBULATORY_CARE_PROVIDER_SITE_OTHER): Payer: BC Managed Care – PPO | Admitting: Family Medicine

## 2012-08-20 VITALS — BP 114/70 | HR 82 | Temp 99.1°F | Ht 61.5 in | Wt 128.0 lb

## 2012-08-20 DIAGNOSIS — R6884 Jaw pain: Secondary | ICD-10-CM

## 2012-08-20 DIAGNOSIS — Z0000 Encounter for general adult medical examination without abnormal findings: Secondary | ICD-10-CM

## 2012-08-20 NOTE — Progress Notes (Signed)
  Subjective:    Patient ID: [name], female    DOB: [birth year], 42 y.o.   MRN: [id number]  HPI 42 yr old female for a cpx. She has been doing well with one exception. For the past 5 weeks she has had mild pain in the left cheek, left lower jaw, and left ear. We saw her for this on 07-24-12 and diagnosed her with parotiditis. At that time the left cheek was swollen and she was having chills like a fever. She was given Keflex and she felt better with less pain. The swelling and chills resolved. However she still has some pain in the area that has persisted for 6 weeks now. Her dentist could not find an etiology although TMJ was in the differential list. She has no pain with chewing. She is scheduled to see an oral surgeon in 2 days.    Review of Systems  Constitutional: Negative.   HENT: Positive for ear pain. Negative for hearing loss, congestion, sore throat, facial swelling, drooling, mouth sores, trouble swallowing, neck pain, neck stiffness, voice change, postnasal drip, sinus pressure, tinnitus and ear discharge.   Eyes: Negative.   Respiratory: Negative.   Cardiovascular: Negative.   Gastrointestinal: Negative.   Genitourinary: Negative for dysuria, urgency, frequency, hematuria, flank pain, decreased urine volume, enuresis, difficulty urinating, pelvic pain and dyspareunia.  Musculoskeletal: Negative.   Skin: Negative.   Neurological: Negative.   Psychiatric/Behavioral: Negative.        Objective:   Physical Exam  Constitutional: She is oriented to person, place, and time. She appears well-developed and well-nourished. No distress.  HENT:  Head: Normocephalic and atraumatic.  Right Ear: External ear normal.  Left Ear: External ear normal.  Nose: Nose normal.  Mouth/Throat: Oropharynx is clear and moist. No oropharyngeal exudate.  Mildly tender over the left cheek just anterior to the left ear but not over the left TMJ   Eyes: Conjunctivae and EOM are normal. Pupils are  equal, round, and reactive to light. No scleral icterus.  Neck: Normal range of motion. Neck supple. No JVD present. No thyromegaly present.  Cardiovascular: Normal rate, regular rhythm, normal heart sounds and intact distal pulses.  Exam reveals no gallop and no friction rub.   No murmur heard. Pulmonary/Chest: Effort normal and breath sounds normal. No respiratory distress. She has no wheezes. She has no rales. She exhibits no tenderness.  Abdominal: Soft. Bowel sounds are normal. She exhibits no distension and no mass. There is no tenderness. There is no rebound and no guarding.  Musculoskeletal: Normal range of motion. She exhibits no edema and no tenderness.  Lymphadenopathy:    She has no cervical adenopathy.  Neurological: She is alert and oriented to person, place, and time. She has normal reflexes. No cranial nerve deficit. She exhibits normal muscle tone. Coordination normal.  Skin: Skin is warm and dry. No rash noted. No erythema.  Psychiatric: She has a normal mood and affect. Her behavior is normal. Judgment and thought content normal.          Assessment & Plan:  Well exam. It is not clear what the cause of her left cheek pain is but we will see what the oral surgeon has to say about this

## 2012-08-21 ENCOUNTER — Telehealth: Payer: Self-pay | Admitting: Family Medicine

## 2012-08-21 DIAGNOSIS — R6884 Jaw pain: Secondary | ICD-10-CM

## 2012-08-21 NOTE — Telephone Encounter (Signed)
Pt had discussed w/ MD about jaw pain. He recommended CAT Scan.  Pt thought she had a dental appt, but pt no longer has that dental appt and would like to schedule CAT scan  ASAP .  Pls advise.

## 2012-08-21 NOTE — Telephone Encounter (Signed)
I ordered the CT

## 2012-08-21 NOTE — Telephone Encounter (Signed)
I spoke with pt  

## 2012-08-23 NOTE — Addendum Note (Signed)
Addended by: Gershon Crane A on: 08/23/2012 10:06 AM   Modules accepted: Orders

## 2012-08-29 ENCOUNTER — Ambulatory Visit (INDEPENDENT_AMBULATORY_CARE_PROVIDER_SITE_OTHER)
Admission: RE | Admit: 2012-08-29 | Discharge: 2012-08-29 | Disposition: A | Discharge status: Home / Self Care | Payer: BC Managed Care – PPO | Source: Ambulatory Visit | Attending: Family Medicine | Admitting: Family Medicine

## 2012-08-29 DIAGNOSIS — R6884 Jaw pain: Secondary | ICD-10-CM

## 2012-08-29 MED ORDER — IOHEXOL 300 MG/ML  SOLN
80.0000 mL | Freq: Once | INTRAMUSCULAR | Status: AC | PRN
  Administered 2012-08-29: 80 mL via INTRAVENOUS

## 2012-08-30 ENCOUNTER — Telehealth: Payer: Self-pay | Admitting: Family Medicine

## 2012-08-30 NOTE — Telephone Encounter (Signed)
Patient called stating that she would like a call back today with CT scan results. Please assist.

## 2012-08-30 NOTE — Telephone Encounter (Signed)
Called and spoke with pt and pt is aware of results.  Labs sent to Dr. Pollyann Kennedy

## 2012-08-30 NOTE — Telephone Encounter (Signed)
Pt calling back. Wants results before weekend please. Thank you & please call.

## 2012-08-30 NOTE — Telephone Encounter (Signed)
See results note. 

## 2012-08-30 NOTE — Progress Notes (Signed)
Called and spoke with pt and pt is aware.  

## 2012-08-30 NOTE — Telephone Encounter (Signed)
I faxed results to below doctor.

## 2013-01-31 ENCOUNTER — Other Ambulatory Visit (HOSPITAL_COMMUNITY): Payer: Self-pay | Admitting: Obstetrics and Gynecology

## 2013-01-31 DIAGNOSIS — R1031 Right lower quadrant pain: Secondary | ICD-10-CM

## 2013-02-03 ENCOUNTER — Encounter (HOSPITAL_COMMUNITY): Payer: Self-pay

## 2013-02-03 ENCOUNTER — Ambulatory Visit (HOSPITAL_COMMUNITY)
Admission: RE | Admit: 2013-02-03 | Discharge: 2013-02-03 | Disposition: A | Discharge status: Home / Self Care | Payer: BC Managed Care – PPO | Source: Ambulatory Visit | Attending: Obstetrics and Gynecology | Admitting: Obstetrics and Gynecology

## 2013-02-03 DIAGNOSIS — R1031 Right lower quadrant pain: Secondary | ICD-10-CM

## 2013-02-03 DIAGNOSIS — N83209 Unspecified ovarian cyst, unspecified side: Secondary | ICD-10-CM | POA: Insufficient documentation

## 2013-02-03 DIAGNOSIS — K449 Diaphragmatic hernia without obstruction or gangrene: Secondary | ICD-10-CM | POA: Insufficient documentation

## 2013-02-03 MED ORDER — IOHEXOL 300 MG/ML  SOLN
100.0000 mL | Freq: Once | INTRAMUSCULAR | Status: AC | PRN
  Administered 2013-02-03: 100 mL via INTRAVENOUS

## 2013-02-05 ENCOUNTER — Telehealth: Payer: Self-pay | Admitting: Family Medicine

## 2013-02-05 NOTE — Telephone Encounter (Signed)
Pt is requesting to be seen by Dr. Clent Ridges on Thursday or Friday (anytime after 9am), opposed to waiting until Monday morning. She states that she is having severe abdominal pain. Please assist.

## 2013-02-05 NOTE — Telephone Encounter (Signed)
Scheduled

## 2013-02-05 NOTE — Telephone Encounter (Signed)
Lm for pt to cb.

## 2013-02-05 NOTE — Telephone Encounter (Signed)
Okay to schedule

## 2013-02-06 ENCOUNTER — Encounter: Payer: Self-pay | Admitting: Family Medicine

## 2013-02-06 ENCOUNTER — Ambulatory Visit (INDEPENDENT_AMBULATORY_CARE_PROVIDER_SITE_OTHER): Payer: BC Managed Care – PPO | Admitting: Family Medicine

## 2013-02-06 VITALS — BP 98/70 | HR 31 | Temp 98.5°F | Wt 124.0 lb

## 2013-02-06 DIAGNOSIS — R1031 Right lower quadrant pain: Secondary | ICD-10-CM

## 2013-02-06 LAB — CBC WITH DIFFERENTIAL/PLATELET
Basophils Relative: 1 % (ref 0.0–3.0)
Eosinophils Relative: 1.9 % (ref 0.0–5.0)
HCT: 39.1 % (ref 36.0–46.0)
Hemoglobin: 13.3 g/dL (ref 12.0–15.0)
Lymphs Abs: 1.6 10*3/uL (ref 0.7–4.0)
MCV: 89.1 fl (ref 78.0–100.0)
Monocytes Absolute: 0.5 10*3/uL (ref 0.1–1.0)
Monocytes Relative: 8.9 % (ref 3.0–12.0)
Neutro Abs: 3 10*3/uL (ref 1.4–7.7)
RBC: 4.39 Mil/uL (ref 3.87–5.11)
WBC: 5.3 10*3/uL (ref 4.5–10.5)

## 2013-02-06 LAB — HEPATIC FUNCTION PANEL
Alkaline Phosphatase: 25 U/L — ABNORMAL LOW (ref 39–117)
Bilirubin, Direct: 0 mg/dL (ref 0.0–0.3)
Total Bilirubin: 0.6 mg/dL (ref 0.3–1.2)
Total Protein: 6.8 g/dL (ref 6.0–8.3)

## 2013-02-06 LAB — POCT URINALYSIS DIPSTICK
Glucose, UA: NEGATIVE
Ketones, UA: NEGATIVE
Spec Grav, UA: 1.02
Urobilinogen, UA: 0.2

## 2013-02-06 LAB — BASIC METABOLIC PANEL
CO2: 26 mEq/L (ref 19–32)
Calcium: 9.3 mg/dL (ref 8.4–10.5)
Chloride: 104 mEq/L (ref 96–112)
Sodium: 136 mEq/L (ref 135–145)

## 2013-02-06 LAB — TSH: TSH: 1.12 u[IU]/mL (ref 0.35–5.50)

## 2013-02-06 NOTE — Progress Notes (Signed)
  Subjective:    Patient ID: [name], female    DOB: [birth year], 42 y.o.   MRN: [id number]  HPI Here for 3 months of RLQ pain. This has been more intense and more consistent the past 3 weeks. She has this pain most of the time but it does wax and wane. It is sharp and very well localized. The pain agets much worse the first 2-3 days of her period, and then it improves a bit. She has had some fevers to 100 degrees. She gets nauseated but has not vomited. No change in bowel habits or urinations. She is on day 3 of her menses today. She has been seeing Dr. Marcelle Overlie about this and they think it could be due to endometriosis. He ordered a CT scan on 01-31-13 which showed only a mall left ovarian cyst. The right ovary, bowels and appendix were all normal. The pain has no relationship to eating.    Review of Systems  Constitutional: Positive for fever.  Respiratory: Negative.   Cardiovascular: Negative.   Gastrointestinal: Positive for nausea and abdominal pain. Negative for vomiting, diarrhea, constipation, blood in stool, abdominal distention, anal bleeding and rectal pain.  Genitourinary: Negative.        Objective:   Physical Exam  Constitutional: She appears well-developed and well-nourished. No distress.  Cardiovascular: Normal rate, regular rhythm, normal heart sounds and intact distal pulses.   Pulmonary/Chest: Effort normal and breath sounds normal.  Abdominal: Soft. Bowel sounds are normal. She exhibits no distension and no mass. There is no tenderness. There is no rebound and no guarding.          Assessment & Plan:  The pattern of this pain makes it most likely to be from endometriosis. We will get labs today. If these are unremarkable, she will follow up with Dr. Marcelle Overlie.

## 2013-02-07 NOTE — Progress Notes (Signed)
Quick Note:  I released results in my chart. ______ 

## 2013-02-10 ENCOUNTER — Ambulatory Visit: Payer: BC Managed Care – PPO | Admitting: Family Medicine

## 2013-02-12 ENCOUNTER — Telehealth: Payer: Self-pay | Admitting: Family Medicine

## 2013-02-12 NOTE — Telephone Encounter (Signed)
Pt needs blood work results from 9-11

## 2013-02-12 NOTE — Telephone Encounter (Signed)
I spoke with pt and gave results.  

## 2013-04-01 ENCOUNTER — Ambulatory Visit (INDEPENDENT_AMBULATORY_CARE_PROVIDER_SITE_OTHER): Payer: BC Managed Care – PPO

## 2013-04-01 DIAGNOSIS — Z23 Encounter for immunization: Secondary | ICD-10-CM

## 2013-05-12 ENCOUNTER — Encounter: Payer: Self-pay | Admitting: Family

## 2013-05-12 ENCOUNTER — Ambulatory Visit (INDEPENDENT_AMBULATORY_CARE_PROVIDER_SITE_OTHER): Payer: BC Managed Care – PPO | Admitting: Family

## 2013-05-12 VITALS — BP 110/74 | HR 109 | Temp 99.0°F | Wt 129.0 lb

## 2013-05-12 DIAGNOSIS — R05 Cough: Secondary | ICD-10-CM

## 2013-05-12 DIAGNOSIS — J069 Acute upper respiratory infection, unspecified: Secondary | ICD-10-CM

## 2013-05-12 DIAGNOSIS — R059 Cough, unspecified: Secondary | ICD-10-CM

## 2013-05-12 MED ORDER — METHYLPREDNISOLONE 4 MG PO KIT: PACK | ORAL | Status: AC

## 2013-05-12 NOTE — Patient Instructions (Signed)

## 2013-05-12 NOTE — Progress Notes (Signed)
Subjective:    Patient ID: [name], female    DOB: Jul 29, 1970, 42 y.o.   MRN: [id number]  HPI  42 year old caucasian female, nonsmoker, presenting with sore throat, cough, nasal congestion, fever,  and body aches since last Wednesday.  She has taken Mucinex 600mg  x2 nights.  She feels better than she has but cough and nasal congestion still present.    Review of Systems  Constitutional: Positive for fever and fatigue.       Complains of body aches and fever  HENT: Positive for congestion, postnasal drip and sore throat.        Complains of nasal congestion, runny nose, and sore throat.  Eyes: Negative.   Respiratory: Positive for cough.        Complains of a cough  Cardiovascular: Negative.   Gastrointestinal: Negative.   Endocrine: Negative.   Genitourinary: Negative.   Musculoskeletal: Negative.   Skin: Negative.   Allergic/Immunologic: Negative.   Neurological: Negative.   Hematological: Negative.   Psychiatric/Behavioral: Negative.    Past Medical History  Diagnosis Date  . Allergy   . Low back pain   . Sinus infection   . Seasonal allergies   . Palpitations   . Actinic dermatitis     sees Dr. Charlton Haws   . Routine gynecological examination     sees Dr. Marcelle Overlie     History   Social History  . Marital Status: Married    Spouse Name: N/A    Number of Children: N/A  . Years of Education: N/A   Occupational History  . Not on file.   Social History Main Topics  . Smoking status: Former Games developer  . Smokeless tobacco: Never Used  . Alcohol Use: 4.2 oz/week    7 Glasses of wine per week  . Drug Use: No  . Sexual Activity: Not on file   Other Topics Concern  . Not on file   Social History Narrative  . No narrative on file    Past Surgical History  Procedure Laterality Date  . Dysplasia      cervical ablation  . Lumbar esi      Dr Jeral Fruit    Family History  Problem Relation Age of Onset  . Hyperthyroidism Mother     Graves   . Coronary  artery disease Father     Allergies  Allergen Reactions  . Amoxicillin Diarrhea  . Clarithromycin     REACTION: rash  . Erythromycin Base     REACTION: rash  . Moxifloxacin     REACTION: lips swell, anxiety    Current Outpatient Prescriptions on File Prior to Visit  Medication Sig Dispense Refill  . ibuprofen (ADVIL,MOTRIN) 200 MG tablet Take 200 mg by mouth daily as needed. For pain       . loratadine (CLARITIN) 10 MG tablet Take 10 mg by mouth daily as needed.       . Probiotic Product (PROBIOTIC DAILY PO) Take by mouth.       No current facility-administered medications on file prior to visit.    BP 110/74  Pulse 109  Temp(Src) 99 F (37.2 C) (Oral)  Wt 129 lb (58.514 kg)chart    Objective:   Physical Exam  Constitutional: She is oriented to person, place, and time. She appears well-developed and well-nourished.  HENT:  Head: Normocephalic.  Right Ear: External ear normal.  Left Ear: External ear normal.  Cough and nasal congestion present. Denies frontal and maxillary sinuses tenderness on  palaption  Neck: Normal range of motion. Neck supple.  Cardiovascular: Normal rate, regular rhythm and normal heart sounds.   Pulmonary/Chest: Effort normal and breath sounds normal.  Neurological: She is alert and oriented to person, place, and time.  Skin: Skin is warm and dry.          Assessment & Plan:  Assessment 1. Upper respiratory infection 2. Cough  Plan Medrol dose pak x 21 pills.  Continue Delsym as scheduled.. Encourage plenty of fluids and strict handwashing. Contact office if symptoms persist or worsen.

## 2013-05-12 NOTE — Progress Notes (Signed)
Pre visit review using our clinic review tool, if applicable. No additional management support is needed unless otherwise documented below in the visit note. 

## 2013-05-14 ENCOUNTER — Telehealth: Payer: Self-pay | Admitting: Family Medicine

## 2013-05-14 NOTE — Telephone Encounter (Signed)
opned in error. Made appt for pt

## 2013-05-15 ENCOUNTER — Encounter: Payer: Self-pay | Admitting: Family Medicine

## 2013-05-15 ENCOUNTER — Ambulatory Visit (INDEPENDENT_AMBULATORY_CARE_PROVIDER_SITE_OTHER): Payer: BC Managed Care – PPO | Admitting: Family Medicine

## 2013-05-15 VITALS — BP 119/87 | HR 92 | Temp 98.7°F | Resp 18 | Ht 62.5 in | Wt 126.0 lb

## 2013-05-15 DIAGNOSIS — J209 Acute bronchitis, unspecified: Secondary | ICD-10-CM

## 2013-05-15 DIAGNOSIS — J019 Acute sinusitis, unspecified: Secondary | ICD-10-CM

## 2013-05-15 MED ORDER — CEFUROXIME AXETIL 250 MG PO TABS: 250.0000 mg | ORAL_TABLET | Freq: Two times a day (BID) | ORAL | Status: DC

## 2013-05-15 MED ORDER — HYDROCODONE-HOMATROPINE 5-1.5 MG/5ML PO SYRP: ORAL_SOLUTION | ORAL | Status: DC

## 2013-05-15 NOTE — Progress Notes (Signed)
Pre visit review using our clinic review tool, if applicable. No additional management support is needed unless otherwise documented below in the visit note. OFFICE NOTE  05/15/2013  CC:  Chief Complaint  Patient presents with  . URI     HPI: Patient is a 42 y.o. Caucasian female who is here for persistent respiratory illness. Onset 8d ago ST, nasal cong/runny nose, sneezing, cough. Was seen 3d/a, got prednisone taper but really doesn't feel any better. Upper teeth starting to bother her, facial pressure worsening, lots of coughing still.   Feels low grade fevers.  +Fatigue, +malaise.  No SOB or wheezing. Mucinex.  Pertinent PMH:  Past Medical History  Diagnosis Date  . Allergy   . Low back pain   . Sinus infection   . Seasonal allergies   . Palpitations   . Actinic dermatitis     sees Dr. Charlton Haws   . Routine gynecological examination     sees Dr. Marcelle Overlie     MEDS:  Outpatient Prescriptions Prior to Visit  Medication Sig Dispense Refill  . ibuprofen (ADVIL,MOTRIN) 200 MG tablet Take 200 mg by mouth daily as needed. For pain       . loratadine (CLARITIN) 10 MG tablet Take 10 mg by mouth daily as needed.       . methylPREDNISolone (MEDROL DOSEPAK) 4 MG tablet follow package directions  21 tablet  0  . Probiotic Product (PROBIOTIC DAILY PO) Take by mouth.       No facility-administered medications prior to visit.    PE: Blood pressure 119/87, pulse 92, temperature 98.7 F (37.1 C), temperature source Temporal, resp. rate 18, height 5' 2.5" (1.588 m), weight 126 lb (57.153 kg), last menstrual period 04/28/2013, SpO2 100.00%. VS: noted--normal. Gen: alert, NAD, NONTOXIC APPEARING. HEENT: eyes without injection, drainage, or swelling.  Ears: EACs clear, TMs with normal light reflex and landmarks.  Nose: Clear rhinorrhea, with some dried, crusty exudate adherent to mildly injected mucosa.  No purulent d/c.  No paranasal sinus TTP.  No facial swelling.  Throat and mouth  without focal lesion.  No pharyngial swelling, erythema, or exudate.   Neck: supple, no LAD.   LUNGS: CTA bilat, nonlabored resps.   CV: RRR, no m/r/g. EXT: no c/c/e SKIN: no rash  LAB: none  IMPRESSION AND PLAN:  Prolonged resp infection--upper and lower, possibly bacterial superinfection. Not signif improved on steroids.  No sign of RAD. Ceftin 250mg  bid x 10d.  Finish steroid taper. Hycodan susp, 1-2 tsp q6h prn, #165ml, no RF. Continue mucinex prn.  An After Visit Summary was printed and given to the patient.  FOLLOW UP: prn

## 2013-05-30 ENCOUNTER — Telehealth: Payer: Self-pay | Admitting: Family Medicine

## 2013-05-30 NOTE — Telephone Encounter (Signed)
Both of pt's children have tested pos for the type a  flu. Pt would like to know if you would call in an rx for her in case she starts to get symptoms this weekend.  [facility]

## 2013-06-02 MED ORDER — OSELTAMIVIR PHOSPHATE 75 MG PO CAPS: 75.0000 mg | ORAL_CAPSULE | Freq: Two times a day (BID) | ORAL | Status: DC

## 2013-06-02 NOTE — Telephone Encounter (Signed)
Per Dr. Clent Ridges call in Tamiflu bid times 5 days, please send in script.

## 2013-06-02 NOTE — Telephone Encounter (Signed)
I sent script e-scribe and spoke with pt. 

## 2013-11-27 NOTE — H&P (Signed)
[name]  DICTATION # [id number] CSN# [id number]   Meriel Pica, MD 11/27/2013 1:56 PM

## 2013-11-28 ENCOUNTER — Encounter (HOSPITAL_COMMUNITY): Payer: Self-pay | Admitting: Pharmacist

## 2013-11-28 NOTE — H&P (Signed)
[name], [name]              ACCOUNT NO.:  [id number]  MEDICAL RECORD NO.:  [id number]  LOCATION:                                 FACILITY:  PHYSICIAN:  Duke Salvia. Marcelle Overlie, M.D.DATE OF BIRTH:  10-11-1970  DATE OF ADMISSION:  12/12/2013 DATE OF DISCHARGE:  12/12/2013                             HISTORY & PHYSICAL   CHIEF COMPLAINT:  Chronic pelvic pain, mostly right lower quadrant and mid pelvic pain.  HISTORY OF PRESENT ILLNESS:  A 43 year old, G2, P2.  Her husband has had a vasectomy.  I saw her initially approximately a year ago with complaints of right-sided pelvic pain with increasing dysmenorrhea. Evaluation in our office at that time 07/14 ultrasound was normal except for a thin-walled left ovarian cyst, no free fluid.  She continued to have problems with pain such that further imaging was done, CT done on 09/14 showed a normal appendix with no other abnormalities except a small cyst on the left side.  She has continued to have significant problems with pain in mid pelvis to the right and now presents for diagnostic laparoscopy, possible USO.  This procedure including specific risks related to bleeding, infection, adjacent organ injury, wound infection, phlebitis along with her expected recovery time reviewed. Discussed the possibility of a laparotomy to complete RSO if indicated.  PAST MEDICAL HISTORY:  Allergies; ERYTHROMYCIN, AVELOX.  CURRENT MEDICATIONS:  _______ and Claritin p.r.n.  SURGICAL HISTORY:  Had LEEP in 99, arthroscopic knee surgery 2001, and 2 vaginal deliveries.  REVIEW OF SYSTEMS:  Significant for prior history of abnormal Pap.  FAMILY HISTORY:  Significant for migraine headache, heart disease, thyroid disease.  SOCIAL HISTORY:  She is married.  Occasional alcohol use.  Denies tobacco or drug use.  Dr. Gershon Crane is her medical doctor.  PHYSICAL EXAMINATION:  VITAL SIGNS:  Temp 98.2, blood pressure 120/78. HEENT: Unremarkable.  NECK: Supple  without masses.  LUNGS:  Clear. CARDIOVASCULAR:  Regular rate and rhythm.  No murmurs, rubs, gallops. BREASTS:  Without masses.  ABDOMEN:  Soft, flat, nontender.  PELVIC: Vulva, vagina, cervix normal.  Uterus mid position, normal size, slightly tender to manipulation.  Right side slightly tender, but no definite mass or nodularity.  Left side negative.  EXTREMITIES: Unremarkable.  NEUROLOGIC: Unremarkable.  IMPRESSION:  Chronic right lower quadrant pain and mid pelvic pain and dysmenorrhea.  PLAN:  Diagnostic laparoscopy, possible USO.  Discussed the possibilities of endometriosis or adnexal adhesions as a source for her pain.  Procedure and risks discussed as above.     Demiana Crumbley M. Marcelle Overlie, M.D.     [facility]/MEDQ  D:  11/27/2013  T:  11/27/2013  Job:  [id number]

## 2013-12-04 ENCOUNTER — Encounter (HOSPITAL_COMMUNITY): Payer: Self-pay | Admitting: *Deleted

## 2013-12-12 ENCOUNTER — Encounter (HOSPITAL_COMMUNITY)
Admission: RE | Disposition: A | Discharge status: Home / Self Care | Payer: Self-pay | Source: Ambulatory Visit | Attending: Obstetrics and Gynecology

## 2013-12-12 ENCOUNTER — Ambulatory Visit (HOSPITAL_COMMUNITY)
Admission: RE | Admit: 2013-12-12 | Discharge: 2013-12-12 | Disposition: A | Discharge status: Home / Self Care | Payer: BC Managed Care – PPO | Source: Ambulatory Visit | Attending: Obstetrics and Gynecology | Admitting: Obstetrics and Gynecology

## 2013-12-12 ENCOUNTER — Encounter (HOSPITAL_COMMUNITY): Payer: BC Managed Care – PPO | Admitting: Anesthesiology

## 2013-12-12 ENCOUNTER — Encounter (HOSPITAL_COMMUNITY): Payer: Self-pay | Admitting: Certified Registered"

## 2013-12-12 ENCOUNTER — Ambulatory Visit (HOSPITAL_COMMUNITY): Payer: BC Managed Care – PPO | Admitting: Anesthesiology

## 2013-12-12 DIAGNOSIS — N949 Unspecified condition associated with female genital organs and menstrual cycle: Secondary | ICD-10-CM | POA: Insufficient documentation

## 2013-12-12 DIAGNOSIS — N803 Endometriosis of pelvic peritoneum, unspecified: Secondary | ICD-10-CM | POA: Diagnosis not present

## 2013-12-12 DIAGNOSIS — N946 Dysmenorrhea, unspecified: Secondary | ICD-10-CM | POA: Diagnosis not present

## 2013-12-12 DIAGNOSIS — IMO0002 Reserved for concepts with insufficient information to code with codable children: Secondary | ICD-10-CM | POA: Diagnosis not present

## 2013-12-12 DIAGNOSIS — Z87891 Personal history of nicotine dependence: Secondary | ICD-10-CM | POA: Diagnosis not present

## 2013-12-12 DIAGNOSIS — G8929 Other chronic pain: Secondary | ICD-10-CM | POA: Insufficient documentation

## 2013-12-12 HISTORY — PX: LAPAROSCOPY: SHX197

## 2013-12-12 LAB — CBC
HCT: 41.6 % (ref 36.0–46.0)
Hemoglobin: 14.4 g/dL (ref 12.0–15.0)
MCH: 31.2 pg (ref 26.0–34.0)
MCHC: 34.6 g/dL (ref 30.0–36.0)
MCV: 90 fL (ref 78.0–100.0)
Platelets: 177 K/uL (ref 150–400)
RBC: 4.62 MIL/uL (ref 3.87–5.11)
RDW: 12.5 % (ref 11.5–15.5)
WBC: 7 K/uL (ref 4.0–10.5)

## 2013-12-12 LAB — PREGNANCY, URINE: Preg Test, Ur: NEGATIVE

## 2013-12-12 SURGERY — LAPAROSCOPY, DIAGNOSTIC
Anesthesia: General | Site: Abdomen

## 2013-12-12 MED ORDER — KETOROLAC TROMETHAMINE 30 MG/ML IJ SOLN
INTRAMUSCULAR | Status: DC | PRN
  Administered 2013-12-12: 30 mg via INTRAVENOUS

## 2013-12-12 MED ORDER — ROCURONIUM BROMIDE 100 MG/10ML IV SOLN
INTRAVENOUS | Status: DC | PRN
  Administered 2013-12-12: 35 mg via INTRAVENOUS

## 2013-12-12 MED ORDER — BUPIVACAINE HCL (PF) 0.25 % IJ SOLN
INTRAMUSCULAR | Status: DC | PRN
  Administered 2013-12-12: 11 mL

## 2013-12-12 MED ORDER — SODIUM CHLORIDE 0.9 % IJ SOLN
INTRAMUSCULAR | Status: AC
Start: 2013-12-12 — End: 2013-12-12
  Filled 2013-12-12: qty 10

## 2013-12-12 MED ORDER — PHENYLEPHRINE HCL 10 MG/ML IJ SOLN
INTRAMUSCULAR | Status: DC | PRN
  Administered 2013-12-12: 80 ug via INTRAVENOUS

## 2013-12-12 MED ORDER — PROPOFOL 10 MG/ML IV BOLUS
INTRAVENOUS | Status: DC | PRN
  Administered 2013-12-12: 200 mg via INTRAVENOUS

## 2013-12-12 MED ORDER — LIDOCAINE HCL (CARDIAC) 20 MG/ML IV SOLN
INTRAVENOUS | Status: AC
  Filled 2013-12-12: qty 5

## 2013-12-12 MED ORDER — LACTATED RINGERS IV SOLN
INTRAVENOUS | Status: DC
  Administered 2013-12-12: 07:00:00 via INTRAVENOUS
  Administered 2013-12-12: 10 mL/h via INTRAVENOUS
  Administered 2013-12-12: 08:00:00 via INTRAVENOUS

## 2013-12-12 MED ORDER — MEPERIDINE HCL 25 MG/ML IJ SOLN: 6.2500 mg | INTRAMUSCULAR | Status: DC | PRN

## 2013-12-12 MED ORDER — KETOROLAC TROMETHAMINE 30 MG/ML IJ SOLN
INTRAMUSCULAR | Status: AC
  Filled 2013-12-12: qty 1

## 2013-12-12 MED ORDER — FENTANYL CITRATE 0.05 MG/ML IJ SOLN
25.0000 ug | INTRAMUSCULAR | Status: DC | PRN
  Administered 2013-12-12: 25 ug via INTRAVENOUS

## 2013-12-12 MED ORDER — SCOPOLAMINE 1 MG/3DAYS TD PT72
MEDICATED_PATCH | TRANSDERMAL | Status: AC
  Administered 2013-12-12: 1.5 mg via TRANSDERMAL
  Filled 2013-12-12: qty 1

## 2013-12-12 MED ORDER — NEOSTIGMINE METHYLSULFATE 10 MG/10ML IV SOLN
INTRAVENOUS | Status: AC
  Filled 2013-12-12: qty 1

## 2013-12-12 MED ORDER — OXYCODONE-ACETAMINOPHEN 5-325 MG PO TABS: 1.0000 | ORAL_TABLET | ORAL | Status: DC | PRN

## 2013-12-12 MED ORDER — FENTANYL CITRATE 0.05 MG/ML IJ SOLN
INTRAMUSCULAR | Status: AC
  Filled 2013-12-12: qty 5

## 2013-12-12 MED ORDER — PHENYLEPHRINE 40 MCG/ML (10ML) SYRINGE FOR IV PUSH (FOR BLOOD PRESSURE SUPPORT)
PREFILLED_SYRINGE | INTRAVENOUS | Status: AC
Start: 2013-12-12 — End: 2013-12-12
  Filled 2013-12-12: qty 5

## 2013-12-12 MED ORDER — ONDANSETRON HCL 4 MG/2ML IJ SOLN
INTRAMUSCULAR | Status: AC
  Filled 2013-12-12: qty 2

## 2013-12-12 MED ORDER — METOCLOPRAMIDE HCL 5 MG/ML IJ SOLN
10.0000 mg | Freq: Once | INTRAMUSCULAR | Status: AC | PRN
  Administered 2013-12-12: 10 mg via INTRAVENOUS

## 2013-12-12 MED ORDER — DEXAMETHASONE SODIUM PHOSPHATE 10 MG/ML IJ SOLN
INTRAMUSCULAR | Status: AC
  Filled 2013-12-12: qty 1

## 2013-12-12 MED ORDER — ACETAMINOPHEN 160 MG/5ML PO SOLN
975.0000 mg | Freq: Once | ORAL | Status: AC
  Administered 2013-12-12: 975 mg via ORAL

## 2013-12-12 MED ORDER — BUPIVACAINE HCL (PF) 0.25 % IJ SOLN
INTRAMUSCULAR | Status: AC
  Filled 2013-12-12: qty 30

## 2013-12-12 MED ORDER — MIDAZOLAM HCL 2 MG/2ML IJ SOLN
INTRAMUSCULAR | Status: DC | PRN
  Administered 2013-12-12: 2 mg via INTRAVENOUS

## 2013-12-12 MED ORDER — LIDOCAINE HCL (CARDIAC) 20 MG/ML IV SOLN
INTRAVENOUS | Status: DC | PRN
  Administered 2013-12-12: 80 mg via INTRAVENOUS

## 2013-12-12 MED ORDER — IBUPROFEN 200 MG PO TABS: 800.0000 mg | ORAL_TABLET | Freq: Three times a day (TID) | ORAL | Status: AC | PRN

## 2013-12-12 MED ORDER — NEOSTIGMINE METHYLSULFATE 10 MG/10ML IV SOLN
INTRAVENOUS | Status: DC | PRN
  Administered 2013-12-12: 4 mg via INTRAVENOUS

## 2013-12-12 MED ORDER — ACETAMINOPHEN 160 MG/5ML PO SOLN
ORAL | Status: AC
  Administered 2013-12-12: 975 mg via ORAL
  Filled 2013-12-12: qty 40.6

## 2013-12-12 MED ORDER — METOCLOPRAMIDE HCL 5 MG/ML IJ SOLN
INTRAMUSCULAR | Status: AC
  Administered 2013-12-12: 10 mg via INTRAVENOUS
  Filled 2013-12-12: qty 2

## 2013-12-12 MED ORDER — FENTANYL CITRATE 0.05 MG/ML IJ SOLN
INTRAMUSCULAR | Status: AC
  Administered 2013-12-12: 25 ug via INTRAVENOUS
  Filled 2013-12-12: qty 2

## 2013-12-12 MED ORDER — KETOROLAC TROMETHAMINE 30 MG/ML IJ SOLN: 15.0000 mg | Freq: Once | INTRAMUSCULAR | Status: DC | PRN

## 2013-12-12 MED ORDER — ONDANSETRON HCL 4 MG/2ML IJ SOLN
INTRAMUSCULAR | Status: DC | PRN
  Administered 2013-12-12: 4 mg via INTRAVENOUS

## 2013-12-12 MED ORDER — FENTANYL CITRATE 0.05 MG/ML IJ SOLN
INTRAMUSCULAR | Status: DC | PRN
  Administered 2013-12-12 (×5): 50 ug via INTRAVENOUS

## 2013-12-12 MED ORDER — ROCURONIUM BROMIDE 100 MG/10ML IV SOLN
INTRAVENOUS | Status: AC
  Filled 2013-12-12: qty 1

## 2013-12-12 MED ORDER — DEXAMETHASONE SODIUM PHOSPHATE 10 MG/ML IJ SOLN
INTRAMUSCULAR | Status: DC | PRN
  Administered 2013-12-12: 10 mg via INTRAVENOUS

## 2013-12-12 MED ORDER — GLYCOPYRROLATE 0.2 MG/ML IJ SOLN
INTRAMUSCULAR | Status: AC
  Filled 2013-12-12: qty 3

## 2013-12-12 MED ORDER — GLYCOPYRROLATE 0.2 MG/ML IJ SOLN
INTRAMUSCULAR | Status: DC | PRN
  Administered 2013-12-12: .7 mg via INTRAVENOUS

## 2013-12-12 MED ORDER — PROPOFOL 10 MG/ML IV EMUL
INTRAVENOUS | Status: AC
  Filled 2013-12-12: qty 20

## 2013-12-12 MED ORDER — SCOPOLAMINE 1 MG/3DAYS TD PT72
1.0000 | MEDICATED_PATCH | Freq: Once | TRANSDERMAL | Status: DC
  Administered 2013-12-12: 1.5 mg via TRANSDERMAL

## 2013-12-12 MED ORDER — MIDAZOLAM HCL 2 MG/2ML IJ SOLN
INTRAMUSCULAR | Status: AC
  Filled 2013-12-12: qty 2

## 2013-12-12 SURGICAL SUPPLY — 20 items
CABLE HIGH FREQUENCY MONO STRZ (ELECTRODE) IMPLANT
CATH ROBINSON RED A/P 16FR (CATHETERS) ×2 IMPLANT
CLOTH BEACON ORANGE TIMEOUT ST (SAFETY) ×2 IMPLANT
DERMABOND ADVANCED (GAUZE/BANDAGES/DRESSINGS) ×1
DERMABOND ADVANCED .7 DNX12 (GAUZE/BANDAGES/DRESSINGS) ×1 IMPLANT
DRSG COVADERM PLUS 2X2 (GAUZE/BANDAGES/DRESSINGS) ×4 IMPLANT
DRSG OPSITE POSTOP 3X4 (GAUZE/BANDAGES/DRESSINGS) IMPLANT
GLOVE BIO SURGEON STRL SZ7 (GLOVE) ×2 IMPLANT
GOWN STRL REUS W/TWL LRG LVL3 (GOWN DISPOSABLE) ×4 IMPLANT
NEEDLE INSUFFLATION 120MM (ENDOMECHANICALS) ×2 IMPLANT
NS IRRIG 1000ML POUR BTL (IV SOLUTION) ×2 IMPLANT
PACK LAPAROSCOPY BASIN (CUSTOM PROCEDURE TRAY) ×2 IMPLANT
PROTECTOR NERVE ULNAR (MISCELLANEOUS) ×2 IMPLANT
SET IRRIG TUBING LAPAROSCOPIC (IRRIGATION / IRRIGATOR) IMPLANT
SUT VICRYL RAPIDE 4/0 PS 2 (SUTURE) ×4 IMPLANT
TOWEL OR 17X24 6PK STRL BLUE (TOWEL DISPOSABLE) ×4 IMPLANT
TROCAR OPTI TIP 5M 100M (ENDOMECHANICALS) ×4 IMPLANT
TROCAR XCEL DIL TIP R 11M (ENDOMECHANICALS) ×2 IMPLANT
WARMER LAPAROSCOPE (MISCELLANEOUS) ×2 IMPLANT
WATER STERILE IRR 1000ML POUR (IV SOLUTION) ×2 IMPLANT

## 2013-12-12 NOTE — Discharge Instructions (Signed)

## 2013-12-12 NOTE — Progress Notes (Signed)
The patient was re-examined with no change in status 

## 2013-12-12 NOTE — Op Note (Signed)
Preoperative diagnosis: Chronic pelvic pain  Postoperative diagnosis: Pelvic endometriosis  Procedure: Diagnostic laparoscopy  Surgeon: Marcelle Overlie  Anesthesia: Gen.  Specimens removed: None  Drains: In and out Foley catheter  Procedure and findings:  Patient taken the operating room after an adequate level of general anesthesia was obtained with the legs in stirrups the abdomen perineum and vagina were prepped and draped in the usual fashion for laparoscopy. Appropriate timeout for taken at that point. The bladder was drained, EUA was carried out uterus was normal size mobile anterior adnexa negative. Hulka tenaculum was positioned. Attention directed to the abdomen where the subumbilical area was infiltrated with quarter percent Marcaine plain, the varies needle was introduced without difficulty, its intra-abdominal position was verified by pressure water testing. After 2 L pneumoperitoneum was then created, lap scopic trocar and sleeve were then introduced without difficulty. There was no evidence of any bleeding or trauma. 3 finger breaths above the symphysis in the midline, 5 mm trocar was inserted under direct visualization. The patient then placed in Trendelenburg and the pelvic findings as follows:  The uterus itself was normal size and anterior space there were some early stage multicentric endometriosis noted. In the cul-de-sac there were some peritoneal defects and early stage endometriosis noted primarily along the lateral part of the right uterosacral ligament minimal on the left side. Bilateral adnexa appeared to be unaffected. The broad ligament area. Were free and clear. The appendix appeared to be normal as the upper abdomen the gallbladder was prominent and may of had some isolated adhesions but was otherwise normal. These areas for further documented to plan further treatment instruments removed, gas allowed to escape, defect closed with 4-0 Vicryl subcuticular at the umbilicus and  Dermabond at the suprapubic. She tolerated this well went to recovery room in good condition. Dictated with [facility]  Tziporah Knoke M. Milana Obey.D.

## 2013-12-12 NOTE — Anesthesia Preprocedure Evaluation (Addendum)
Anesthesia Evaluation  Patient identified by MRN, date of birth, ID band Patient awake    Reviewed: Allergy & Precautions, H&P , NPO status , Patient's Chart, lab work & pertinent test results, reviewed documented beta blocker date and time   History of Anesthesia Complications (+) PONV  Airway Mallampati: III TM Distance: >3 FB Neck ROM: full    Dental  (+) Teeth Intact   Pulmonary former smoker,  allergies breath sounds clear to auscultation  Pulmonary exam normal       Cardiovascular negative cardio ROS  Rhythm:regular Rate:Normal     Neuro/Psych Low back pain from bulging disc - POSITION AWAKE negative psych ROS   GI/Hepatic negative GI ROS, Neg liver ROS,   Endo/Other  negative endocrine ROS  Renal/GU negative Renal ROS  Female GU complaint     Musculoskeletal   Abdominal   Peds  Hematology negative hematology ROS (+)   Anesthesia Other Findings   Reproductive/Obstetrics negative OB ROS                          Anesthesia Physical Anesthesia Plan  ASA: II  Anesthesia Plan: General ETT   Post-op Pain Management:    Induction:   Airway Management Planned:   Additional Equipment:   Intra-op Plan:   Post-operative Plan:   Informed Consent: I have reviewed the patients History and Physical, chart, labs and discussed the procedure including the risks, benefits and alternatives for the proposed anesthesia with the patient or authorized representative who has indicated his/her understanding and acceptance.   Dental Advisory Given  Plan Discussed with: CRNA and Surgeon  Anesthesia Plan Comments:         Anesthesia Quick Evaluation

## 2013-12-12 NOTE — Transfer of Care (Signed)
Immediate Anesthesia Transfer of Care Note  Patient: [name]  Procedure(s) Performed: Procedure(s): LAPAROSCOPY DIAGNOSTIC (N/A)  Patient Location: PACU  Anesthesia Type:General  Level of Consciousness: awake, alert  and oriented  Airway & Oxygen Therapy: Patient Spontanous Breathing and Patient connected to nasal cannula oxygen  Post-op Assessment: Report given to PACU RN and Post -op Vital signs reviewed and stable  Post vital signs: Reviewed and stable  Complications: No apparent anesthesia complications

## 2013-12-15 ENCOUNTER — Encounter (HOSPITAL_COMMUNITY): Payer: Self-pay | Admitting: Obstetrics and Gynecology

## 2013-12-15 NOTE — Anesthesia Postprocedure Evaluation (Signed)
  Anesthesia Post-op Note  Patient: [name]  Procedure(s) Performed: Procedure(s): LAPAROSCOPY DIAGNOSTIC (N/A) Patient is awake and responsive. Pain and nausea are reasonably well controlled. Vital signs are stable and clinically acceptable. Oxygen saturation is clinically acceptable. There are no apparent anesthetic complications at this time. Patient is ready for discharge.

## 2014-06-16 ENCOUNTER — Encounter: Payer: Self-pay | Admitting: Family Medicine

## 2014-06-16 ENCOUNTER — Ambulatory Visit (INDEPENDENT_AMBULATORY_CARE_PROVIDER_SITE_OTHER): Payer: BLUE CROSS/BLUE SHIELD | Admitting: Family Medicine

## 2014-06-16 VITALS — BP 96/69 | HR 88 | Temp 98.8°F | Ht 62.0 in | Wt 128.0 lb

## 2014-06-16 DIAGNOSIS — H109 Unspecified conjunctivitis: Secondary | ICD-10-CM

## 2014-06-16 MED ORDER — NEOMYCIN-POLYMYXIN-HC 3.5-10000-1 OP SUSP: 3.0000 [drp] | Freq: Four times a day (QID) | OPHTHALMIC | Status: DC

## 2014-06-16 NOTE — Progress Notes (Signed)
   Subjective:    Patient ID: [name], female    DOB: 1971/05/09, 44 y.o.   MRN: [id number]  HPI Here for 3 days of mild itching, burning and redness in the left eye. No blurred vision or pain. No URI sx. She does not wear contacts.    Review of Systems  Constitutional: Negative.   HENT: Negative.   Eyes: Positive for redness and itching. Negative for photophobia, pain, discharge and visual disturbance.  Respiratory: Negative.        Objective:   Physical Exam  Constitutional: She appears well-developed and well-nourished.  HENT:  Right Ear: External ear normal.  Left Ear: External ear normal.  Nose: Nose normal.  Mouth/Throat: Oropharynx is clear and moist.  Eyes: Pupils are equal, round, and reactive to light.  Left conjunctiva is pink with no DC seen. The right eye is clear          Assessment & Plan:  Recheck prn

## 2014-06-16 NOTE — Progress Notes (Signed)
Pre visit review using our clinic review tool, if applicable. No additional management support is needed unless otherwise documented below in the visit note. 

## 2014-06-30 ENCOUNTER — Encounter: Payer: Self-pay | Admitting: Family Medicine

## 2014-06-30 ENCOUNTER — Ambulatory Visit (INDEPENDENT_AMBULATORY_CARE_PROVIDER_SITE_OTHER): Payer: BLUE CROSS/BLUE SHIELD | Admitting: Family Medicine

## 2014-06-30 VITALS — BP 91/65 | HR 81 | Temp 98.4°F | Ht 62.0 in | Wt 128.0 lb

## 2014-06-30 DIAGNOSIS — J0100 Acute maxillary sinusitis, unspecified: Secondary | ICD-10-CM

## 2014-06-30 MED ORDER — CEFUROXIME AXETIL 500 MG PO TABS: 500.0000 mg | ORAL_TABLET | Freq: Two times a day (BID) | ORAL | Status: DC

## 2014-06-30 MED ORDER — HYDROCODONE-HOMATROPINE 5-1.5 MG/5ML PO SYRP: 5.0000 mL | ORAL_SOLUTION | ORAL | Status: DC | PRN

## 2014-06-30 NOTE — Progress Notes (Signed)
Pre visit review using our clinic review tool, if applicable. No additional management support is needed unless otherwise documented below in the visit note. 

## 2014-06-30 NOTE — Progress Notes (Signed)
   Subjective:    Patient ID: [name], female    DOB: 1971/02/03, 44 y.o.   MRN: [id number]  HPI Here for 5 days of sinus pressure, PND, ST, and a dry cough. No fever. On Mucinex    Review of Systems  Constitutional: Negative.   HENT: Positive for congestion, postnasal drip and sinus pressure.   Eyes: Negative.   Respiratory: Positive for cough.        Objective:   Physical Exam  Constitutional: She appears well-developed and well-nourished.  HENT:  Right Ear: External ear normal.  Left Ear: External ear normal.  Nose: Nose normal.  Mouth/Throat: Oropharynx is clear and moist.  Eyes: Conjunctivae are normal.  Pulmonary/Chest: Effort normal and breath sounds normal.  Lymphadenopathy:    She has no cervical adenopathy.          Assessment & Plan:  Recheck prn

## 2014-07-13 ENCOUNTER — Other Ambulatory Visit (INDEPENDENT_AMBULATORY_CARE_PROVIDER_SITE_OTHER): Payer: BLUE CROSS/BLUE SHIELD

## 2014-07-13 DIAGNOSIS — Z0000 Encounter for general adult medical examination without abnormal findings: Secondary | ICD-10-CM

## 2014-07-13 LAB — CBC WITH DIFFERENTIAL/PLATELET
BASOS PCT: 0.6 % (ref 0.0–3.0)
Basophils Absolute: 0 10*3/uL (ref 0.0–0.1)
Eosinophils Absolute: 0.1 10*3/uL (ref 0.0–0.7)
Eosinophils Relative: 1.6 % (ref 0.0–5.0)
HEMATOCRIT: 38.8 % (ref 36.0–46.0)
Hemoglobin: 13.1 g/dL (ref 12.0–15.0)
LYMPHS PCT: 31.2 % (ref 12.0–46.0)
Lymphs Abs: 2 10*3/uL (ref 0.7–4.0)
MCHC: 33.8 g/dL (ref 30.0–36.0)
MCV: 88.9 fl (ref 78.0–100.0)
MONOS PCT: 5.5 % (ref 3.0–12.0)
Monocytes Absolute: 0.3 10*3/uL (ref 0.1–1.0)
NEUTROS PCT: 61.1 % (ref 43.0–77.0)
Neutro Abs: 3.9 10*3/uL (ref 1.4–7.7)
Platelets: 203 10*3/uL (ref 150.0–400.0)
RBC: 4.37 Mil/uL (ref 3.87–5.11)
RDW: 12.3 % (ref 11.5–15.5)
WBC: 6.3 10*3/uL (ref 4.0–10.5)

## 2014-07-13 LAB — HEPATIC FUNCTION PANEL
ALK PHOS: 29 U/L — AB (ref 39–117)
ALT: 18 U/L (ref 0–35)
AST: 18 U/L (ref 0–37)
Albumin: 3.7 g/dL (ref 3.5–5.2)
BILIRUBIN DIRECT: 0.1 mg/dL (ref 0.0–0.3)
BILIRUBIN TOTAL: 0.4 mg/dL (ref 0.2–1.2)
Total Protein: 6.3 g/dL (ref 6.0–8.3)

## 2014-07-13 LAB — POCT URINALYSIS DIP (MANUAL ENTRY)
BILIRUBIN UA: NEGATIVE
Bilirubin, UA: NEGATIVE
GLUCOSE UA: NEGATIVE
Leukocytes, UA: NEGATIVE
NITRITE UA: NEGATIVE
PH UA: 6
PROTEIN UA: NEGATIVE
Spec Grav, UA: 1.015
Urobilinogen, UA: 0.2

## 2014-07-13 LAB — BASIC METABOLIC PANEL
BUN: 10 mg/dL (ref 6–23)
CO2: 29 meq/L (ref 19–32)
Calcium: 8.8 mg/dL (ref 8.4–10.5)
Chloride: 105 mEq/L (ref 96–112)
Creatinine, Ser: 0.69 mg/dL (ref 0.40–1.20)
GFR: 98.8 mL/min (ref >60.00)
Glucose, Bld: 91 mg/dL (ref 70–99)
POTASSIUM: 4.3 meq/L (ref 3.5–5.1)
Sodium: 137 mEq/L (ref 135–145)

## 2014-07-13 LAB — LIPID PANEL
Cholesterol: 157 mg/dL (ref 0–200)
HDL: 48.5 mg/dL (ref >39.00)
LDL Cholesterol: 92 mg/dL (ref 0–99)
NonHDL: 108.5
Total CHOL/HDL Ratio: 3
Triglycerides: 82 mg/dL (ref 0.0–149.0)
VLDL: 16.4 mg/dL (ref 0.0–40.0)

## 2014-07-13 LAB — TSH: TSH: 1.23 u[IU]/mL (ref 0.35–4.50)

## 2014-07-15 ENCOUNTER — Ambulatory Visit (INDEPENDENT_AMBULATORY_CARE_PROVIDER_SITE_OTHER): Payer: BLUE CROSS/BLUE SHIELD | Admitting: Family Medicine

## 2014-07-15 ENCOUNTER — Encounter: Payer: Self-pay | Admitting: Family Medicine

## 2014-07-15 VITALS — BP 106/77 | HR 81 | Temp 98.8°F | Ht 62.0 in | Wt 126.0 lb

## 2014-07-15 DIAGNOSIS — Z0000 Encounter for general adult medical examination without abnormal findings: Secondary | ICD-10-CM

## 2014-07-15 LAB — VITAMIN B12: Vitamin B-12: 382 pg/mL (ref 211–911)

## 2014-07-15 LAB — VITAMIN D 25 HYDROXY (VIT D DEFICIENCY, FRACTURES): VITD: 22.1 ng/mL — AB (ref 30.00–100.00)

## 2014-07-15 LAB — MAGNESIUM: Magnesium: 2.3 mg/dL (ref 1.5–2.5)

## 2014-07-15 NOTE — Progress Notes (Signed)
   Subjective:    Patient ID: [name], female    DOB: 1970/07/01, 44 y.o.   MRN: [id number]  HPI 44 yr old female for a cpx. She feels well in general. She was recently treated for a sinus infection and this has resolved. She does mention some intermittent tingling in the hands and feet that started a few months ago.    Review of Systems  Constitutional: Negative.   HENT: Negative.   Eyes: Negative.   Respiratory: Negative.   Cardiovascular: Negative.   Gastrointestinal: Negative.   Genitourinary: Negative for dysuria, urgency, frequency, hematuria, flank pain, decreased urine volume, enuresis, difficulty urinating, pelvic pain and dyspareunia.  Musculoskeletal: Negative.   Skin: Negative.   Neurological: Negative.   Psychiatric/Behavioral: Negative.        Objective:   Physical Exam  Constitutional: She is oriented to person, place, and time. She appears well-developed and well-nourished. No distress.  HENT:  Head: Normocephalic and atraumatic.  Right Ear: External ear normal.  Left Ear: External ear normal.  Nose: Nose normal.  Mouth/Throat: Oropharynx is clear and moist. No oropharyngeal exudate.  Eyes: Conjunctivae and EOM are normal. Pupils are equal, round, and reactive to light. No scleral icterus.  Neck: Normal range of motion. Neck supple. No JVD present. No thyromegaly present.  Cardiovascular: Normal rate, regular rhythm, normal heart sounds and intact distal pulses.  Exam reveals no gallop and no friction rub.   No murmur heard. Pulmonary/Chest: Effort normal and breath sounds normal. No respiratory distress. She has no wheezes. She has no rales. She exhibits no tenderness.  Abdominal: Soft. Bowel sounds are normal. She exhibits no distension and no mass. There is no tenderness. There is no rebound and no guarding.  Musculoskeletal: Normal range of motion. She exhibits no edema or tenderness.  Lymphadenopathy:    She has no cervical adenopathy.    Neurological: She is alert and oriented to person, place, and time. She has normal reflexes. No cranial nerve deficit. She exhibits normal muscle tone. Coordination normal.  Skin: Skin is warm and dry. No rash noted. No erythema.  Psychiatric: She has a normal mood and affect. Her behavior is normal. Judgment and thought content normal.          Assessment & Plan:  Well exam. We will check some vitamin levels today. Suggested she take a multivitamin daily.

## 2014-07-15 NOTE — Progress Notes (Signed)
Pre visit review using our clinic review tool, if applicable. No additional management support is needed unless otherwise documented below in the visit note. 

## 2014-08-03 ENCOUNTER — Encounter: Payer: Self-pay | Admitting: Family Medicine

## 2014-08-03 ENCOUNTER — Ambulatory Visit (INDEPENDENT_AMBULATORY_CARE_PROVIDER_SITE_OTHER): Payer: BLUE CROSS/BLUE SHIELD | Admitting: Family Medicine

## 2014-08-03 VITALS — BP 98/73 | HR 100 | Temp 99.4°F | Ht 62.0 in | Wt 125.0 lb

## 2014-08-03 DIAGNOSIS — J029 Acute pharyngitis, unspecified: Secondary | ICD-10-CM

## 2014-08-03 LAB — POCT RAPID STREP A (OFFICE): Rapid Strep A Screen: NEGATIVE

## 2014-08-03 MED ORDER — CEFUROXIME AXETIL 500 MG PO TABS: 500.0000 mg | ORAL_TABLET | Freq: Two times a day (BID) | ORAL | Status: DC

## 2014-08-03 MED ORDER — METHYLPREDNISOLONE 4 MG PO KIT: PACK | ORAL | Status: AC

## 2014-08-03 NOTE — Progress Notes (Signed)
   Subjective:    Patient ID: [name], female    DOB: 1970/10/11, 44 y.o.   MRN: [id number]  HPI Here for 2 weeks of intermittent ST, dry cough., and hoarse voice. She has had a low grade fever. Drinking fluids.   Review of Systems  Constitutional: Positive for fever.  HENT: Positive for congestion, postnasal drip and sore throat. Negative for sinus pressure.   Eyes: Negative.   Respiratory: Positive for cough.        Objective:   Physical Exam  Constitutional: She appears well-developed and well-nourished. No distress.  HENT:  Right Ear: External ear normal.  Nose: Nose normal.  Mouth/Throat: Oropharynx is clear and moist. No oropharyngeal exudate.  Eyes: Conjunctivae are normal.  Pulmonary/Chest: Effort normal and breath sounds normal.          Assessment & Plan:  Probable viral illness although she has been sick for 2 weeks. She asks for a rx for antibiotic to use later this week of she does not improve. We will give her a Medrol dose pack today to help with symptoms. She will have a rx for Ceftin to use as needed.

## 2014-08-03 NOTE — Progress Notes (Signed)
Pre visit review using our clinic review tool, if applicable. No additional management support is needed unless otherwise documented below in the visit note. 

## 2014-08-04 ENCOUNTER — Other Ambulatory Visit: Payer: Self-pay | Admitting: Family Medicine

## 2014-08-04 ENCOUNTER — Telehealth: Payer: Self-pay | Admitting: Family Medicine

## 2014-08-04 ENCOUNTER — Other Ambulatory Visit (INDEPENDENT_AMBULATORY_CARE_PROVIDER_SITE_OTHER): Payer: BLUE CROSS/BLUE SHIELD

## 2014-08-04 DIAGNOSIS — J020 Streptococcal pharyngitis: Secondary | ICD-10-CM

## 2014-08-04 NOTE — Telephone Encounter (Signed)
Pt on sch

## 2014-08-04 NOTE — Telephone Encounter (Signed)
Pt would like to know if we did strep throat culture on her yesteday

## 2014-08-04 NOTE — Telephone Encounter (Signed)
Can you put pt on lab schedule, throat culture only? Pt is on her way now to get test done.

## 2014-08-04 NOTE — Telephone Encounter (Signed)
Per Dr. Clent Ridges, we can order a culture, I put in future order and spoke with pt.

## 2014-08-06 LAB — CULTURE, GROUP A STREP: Organism ID, Bacteria: NORMAL

## 2014-10-05 ENCOUNTER — Ambulatory Visit (INDEPENDENT_AMBULATORY_CARE_PROVIDER_SITE_OTHER): Payer: BLUE CROSS/BLUE SHIELD | Admitting: Family Medicine

## 2014-10-05 ENCOUNTER — Encounter: Payer: Self-pay | Admitting: Family Medicine

## 2014-10-05 VITALS — BP 107/77 | HR 76 | Temp 99.5°F | Ht 62.0 in | Wt 124.0 lb

## 2014-10-05 DIAGNOSIS — L03011 Cellulitis of right finger: Secondary | ICD-10-CM | POA: Diagnosis not present

## 2014-10-05 MED ORDER — DOXYCYCLINE HYCLATE 100 MG PO CAPS: 100.0000 mg | ORAL_CAPSULE | Freq: Two times a day (BID) | ORAL | Status: AC

## 2014-10-05 NOTE — Progress Notes (Signed)
   Subjective:    Patient ID: [name], female    DOB: [birth year], 44 y.o.   MRN: [id number]  HPI Here for an ongoing problem with the right great toe. About a month ago she developed some swelling and pain at the base of the nail. No hx of trauma. She saw Dr. Harriet Pho (podiatrist) about 2 weeks ago who did a procedure to open up the nail area. She was not put on antibiotics at that time. Then she saw her again one week ago and she was given Keflex 500 mg tid. This has not helped either. She is wearing open toed shoes and soaking the toe in Epsom salts every day.    Review of Systems  Constitutional: Positive for fever.  Skin: Positive for wound.       Objective:   Physical Exam  Constitutional: She appears well-developed and well-nourished.  Skin:  The base of the right great toe has an area of swelling, erythema, and tenderness. Gentle pressure produces a tiny amount of purulent fluid.           Assessment & Plan:  Paronychia. Given Doxycycline to cover for any possible MRSA. Recheck prn

## 2014-10-05 NOTE — Progress Notes (Signed)
Pre visit review using our clinic review tool, if applicable. No additional management support is needed unless otherwise documented below in the visit note. 

## 2014-10-14 ENCOUNTER — Telehealth: Payer: Self-pay | Admitting: Family Medicine

## 2014-10-14 NOTE — Telephone Encounter (Signed)
Pt is away right now and toe is not responding to abx now. Pt no longer have low grade fever. Pt has an appt to see podiatry on 10-16-14. Pt toe is still swollen and red. Please advise

## 2014-10-14 NOTE — Telephone Encounter (Signed)
Stay on antibiotics until she sees Podiatry, stay off feet as much as possible, and ice the toe

## 2014-10-14 NOTE — Telephone Encounter (Signed)
I left a voice message for pt with below information.  

## 2014-10-19 ENCOUNTER — Telehealth: Payer: Self-pay | Admitting: Family Medicine

## 2014-10-19 DIAGNOSIS — L03039 Cellulitis of unspecified toe: Secondary | ICD-10-CM

## 2014-10-19 NOTE — Telephone Encounter (Signed)
Pt is aware of dr fry recommendation to [facility]. Pt was given phone # and to make sure they are in her ins network

## 2014-10-19 NOTE — Telephone Encounter (Signed)
I referred her to [facility]

## 2014-10-19 NOTE — Telephone Encounter (Signed)
Patient would like a 2nd opinion for her foot.  She was originally seen by Dr. Charlynn Court, [facility].  She said Dr. Clent Ridges is aware of the right, toe issue.

## 2014-10-29 ENCOUNTER — Encounter: Payer: Self-pay | Admitting: Podiatry

## 2014-10-29 ENCOUNTER — Ambulatory Visit (INDEPENDENT_AMBULATORY_CARE_PROVIDER_SITE_OTHER): Payer: BLUE CROSS/BLUE SHIELD | Admitting: Podiatry

## 2014-10-29 ENCOUNTER — Ambulatory Visit (INDEPENDENT_AMBULATORY_CARE_PROVIDER_SITE_OTHER): Payer: BLUE CROSS/BLUE SHIELD

## 2014-10-29 VITALS — BP 113/75 | HR 92 | Resp 12

## 2014-10-29 DIAGNOSIS — L03011 Cellulitis of right finger: Secondary | ICD-10-CM

## 2014-10-29 DIAGNOSIS — M79672 Pain in left foot: Secondary | ICD-10-CM

## 2014-10-29 DIAGNOSIS — M21611 Bunion of right foot: Secondary | ICD-10-CM

## 2014-10-29 DIAGNOSIS — M79671 Pain in right foot: Secondary | ICD-10-CM

## 2014-10-29 DIAGNOSIS — M2011 Hallux valgus (acquired), right foot: Secondary | ICD-10-CM

## 2014-10-29 MED ORDER — DOXYCYCLINE HYCLATE 100 MG PO TABS: 100.0000 mg | ORAL_TABLET | Freq: Two times a day (BID) | ORAL | Status: DC

## 2014-10-29 NOTE — Progress Notes (Signed)
   Subjective:    Patient ID: [name], female    DOB: [birth year], 44 y.o.   MRN: [id number]  HPI PT STATED RT FOOT GREAT TOENAIL IS BEEN PAINFUL AND SWOLLEN. THE TOENAIL IS BETTER BUT WHEN WEARING SHOES IS PAINFUL. TRIED ORAL ANTIBIOTIC FOR 10 DAYS AND IT HELP SOME.   Review of Systems  Skin: Positive for color change.       Objective:   Physical Exam        Assessment & Plan:

## 2014-10-29 NOTE — Patient Instructions (Signed)

## 2014-10-30 NOTE — Progress Notes (Signed)
Subjective:     Patient ID: [name], female   DOB: Sep 29, 1970, 44 y.o.   MRN: [id number]  HPI patient's been having trouble with her right big toenail for approximately 4 months. She does not remember specific injury and has been on previous antibiotically and was treated by another physician who took out the corner and try to clean it out and then talked about removing the entire nail bed of which she does not want done if possible. It is localized to the medial side and does not extend to the interphalangeal joint or proximal as far as erythema   Review of Systems  All other systems reviewed and are negative.      Objective:   Physical Exam  Constitutional: She is oriented to person, place, and time.  Cardiovascular: Intact distal pulses.   Musculoskeletal: Normal range of motion.  Neurological: She is oriented to person, place, and time.  Skin: Skin is warm.  Nursing note and vitals reviewed.  neurovascular status intact muscle strength adequate with range of motion within normal limits. Patient's right hallux medial side has redness in it and is mildly painful when pressed but it is localized to this area with the possibility of small abscess or drainage noted     Assessment:     Paronychia infection right hallux medial side localized in nature that's been stubborn and present for several months    Plan:     H&P and x-rays taken as precautionary measure and reviewed. Today I infiltrated 60 Milligan times like Marcaine mixture I removed the medial corner clean the area out and did go ahead and do a culture to decide to see what type of bacteria may be present. Placed on oral cephalexin at this time 3 times a day and gave strict instructions of any increase in redness or any issues were to occur to let us know immediately

## 2014-11-06 ENCOUNTER — Telehealth: Payer: Self-pay | Admitting: *Deleted

## 2014-11-06 NOTE — Telephone Encounter (Addendum)
Pt request results of wound culture.  I called L[facility] [phone] and was informed the results would be faxed to [phone].  Dr. Charlsie Merles states the wound culture did not grow out a specific organism, so continue the Doxycycline, which is a broad spectrum antibiotic.  Pt states she has not been consistent with the antibacterial soak and it got a little better, then looks bad again.  I instructed pt to perform Epsom salt soaks as package instructs daily and dress with Bacitracin, until next appt.  Pt states understanding.

## 2014-11-12 ENCOUNTER — Ambulatory Visit (INDEPENDENT_AMBULATORY_CARE_PROVIDER_SITE_OTHER): Payer: BLUE CROSS/BLUE SHIELD | Admitting: Podiatry

## 2014-11-12 ENCOUNTER — Encounter: Payer: Self-pay | Admitting: Podiatry

## 2014-11-12 ENCOUNTER — Telehealth: Payer: Self-pay | Admitting: *Deleted

## 2014-11-12 VITALS — BP 99/62 | HR 102 | Resp 18

## 2014-11-12 DIAGNOSIS — L03031 Cellulitis of right toe: Secondary | ICD-10-CM

## 2014-11-12 DIAGNOSIS — L03011 Cellulitis of right finger: Secondary | ICD-10-CM | POA: Diagnosis not present

## 2014-11-12 MED ORDER — CEPHALEXIN 500 MG PO CAPS: 500.0000 mg | ORAL_CAPSULE | Freq: Two times a day (BID) | ORAL | Status: DC

## 2014-11-12 MED ORDER — HYDROCODONE-ACETAMINOPHEN 5-325 MG PO TABS: 1.0000 | ORAL_TABLET | Freq: Four times a day (QID) | ORAL | Status: DC | PRN

## 2014-11-12 NOTE — Telephone Encounter (Signed)
Pt states she had been progressing well with her toe after the procedure, but about a week ago, it became red, swollen and painful.  Pt requested an appt with Dr. Charlsie Merles.  I referred pt to schedulers for appt.

## 2014-11-12 NOTE — Progress Notes (Signed)
Subjective:     Patient ID: [name], female   DOB: 06/27/1970, 44 y.o.   MRN: [id number]  HPIThis patient presents to the office with pain and redness behind her big toenail right foot.  She gives a history of having the inside big toenail right foot worked on.  It subsequently became infected and was treated with cephalexin.  She was seen by Dr. Charlsie Merles last week who cleaned the medial aspect right toenail under anesthesia and then gave her doxycycline.  She called this morning saying the site is reinfected and desires to be seen.   Review of Systems     Objective:   Physical Exam Objective: Review of past medical history, medications, social history and allergies were performed.  Vascular: Dorsalis pedis and posterior tibial pulses were palpable B/L, capillary refill was  WNL B/L, temperature gradient was WNL B/L   Skin:  No signs of symptoms of infection or ulcers on both feet  Nails: Medial border of right hallux is absent with no infection noted medially.  She has developed red inflamed infected proximal nail fold which is painful to the touch.  The nail itself is yellowed and is moving and pivoting upon palpation.  Sensory: Phoebe Perch monifilament WNL   Orthopedic: Orthopedic evaluation demonstrates all joints distal t ankle have full ROM without crepitus, muscle power WNL B/L    Assessment:    Paronychia Right Hallux     Plan:    I & D right hallux toenail under local anesthesia  Neosporin /DSD.  Cephalexin prescribed.  Prescribed Vicodin 5 mg for pain control.  Home instructions given.

## 2014-11-17 ENCOUNTER — Encounter: Payer: Self-pay | Admitting: Podiatry

## 2014-11-17 ENCOUNTER — Ambulatory Visit (INDEPENDENT_AMBULATORY_CARE_PROVIDER_SITE_OTHER): Payer: BLUE CROSS/BLUE SHIELD | Admitting: Podiatry

## 2014-11-17 VITALS — BP 103/70 | HR 71 | Resp 12

## 2014-11-17 DIAGNOSIS — Z09 Encounter for follow-up examination after completed treatment for conditions other than malignant neoplasm: Secondary | ICD-10-CM

## 2014-11-17 DIAGNOSIS — L309 Dermatitis, unspecified: Secondary | ICD-10-CM

## 2014-11-17 NOTE — Progress Notes (Signed)
Subjective:     Patient ID: [name], female   DOB: [birth year], 44 y.o.   MRN: [id number]  HPIThis patient returns to the office saying her toe is somewhat better since her toenail on right big toe was removed.  She says she is taking her antibiotics and soaking her toe as directed.  Her toe has white mascerated tissue at the site of the surgery.  The pain she had initially has resolved.  She presents for evaluation and treatment.   Review of Systems     Objective:   Physical Exam Objective: Review of past medical history, medications, social history and allergies were performed.  Vascular: Dorsalis pedis and posterior tibial pulses were palpable B/L, capillary refill was  WNL B/L, temperature gradient was WNL B/L   Skin:  No signs of symptoms of infection or ulcers on both feet  Nails: white mascerated tissue at proximal nail fold .  No signs of redness or infection noted.  Granulation tissue at nail bed.  Sensory: Phoebe Perch monifilament WNL   Orthopedic: Orthopedic evaluation demonstrates all joints distal t ankle have full ROM without crepitus, muscle power WNL B/L    Assessment:     S/p nail surgery  Dermatitis secondary to neosporin usage.     Plan:     D/C soaks and neosporin.  Told her to air dry and wear dry bandage.

## 2014-11-19 ENCOUNTER — Ambulatory Visit: Payer: BLUE CROSS/BLUE SHIELD | Admitting: Podiatry

## 2014-12-01 ENCOUNTER — Ambulatory Visit (INDEPENDENT_AMBULATORY_CARE_PROVIDER_SITE_OTHER): Payer: BLUE CROSS/BLUE SHIELD | Admitting: Podiatry

## 2014-12-01 DIAGNOSIS — Z09 Encounter for follow-up examination after completed treatment for conditions other than malignant neoplasm: Secondary | ICD-10-CM

## 2014-12-01 NOTE — Progress Notes (Signed)
Subjective:     Patient ID: [name], female   DOB: 07-06-1970, 44 y.o.   MRN: [id number]  HPIThis patient returns to my office for me to look at her big toenail right foot.  She had the nail avulsed by myself and returned with mascerated tissue at surgical site.  She was given antibiotics and the surgical site has now dried and is pain free. She says there is itching through her toe at night. She presents for continued evaluation following surgery.   Review of Systems     Objective:   Physical Exam Objective: Review of past medical history, medications, social history and allergies were performed.  Vascular: Dorsalis pedis and posterior tibial pulses were palpable B/L, capillary refill was  WNL B/L, temperature gradient was WNL B/L   Skin:  No signs of symptoms of infection or ulcers on both feet  Nails: Examination of right hallux reveals desquamation at proximal nail fold.  No evidence of drainage or infection.  She does have one patch of redness which I told her was vascular in nature not infectious.  Sensory: Phoebe Perch monifilament WNL   Orthopedic: Orthopedic evaluation demonstrates all joints distal t ankle have full ROM without crepitus, muscle power WNL B/L    Assessment:     S/p nail surgery     Plan:     ROV  Prn

## 2015-07-07 ENCOUNTER — Encounter: Payer: Self-pay | Admitting: Podiatry

## 2015-07-07 ENCOUNTER — Ambulatory Visit (INDEPENDENT_AMBULATORY_CARE_PROVIDER_SITE_OTHER): Payer: BLUE CROSS/BLUE SHIELD | Admitting: Podiatry

## 2015-07-07 VITALS — BP 99/66 | HR 83 | Resp 12

## 2015-07-07 DIAGNOSIS — M21611 Bunion of right foot: Secondary | ICD-10-CM | POA: Diagnosis not present

## 2015-07-07 DIAGNOSIS — B351 Tinea unguium: Secondary | ICD-10-CM

## 2015-07-07 NOTE — Progress Notes (Signed)
Subjective:     Patient ID: [name], female   DOB: [birth year], 45 y.o.   MRN: [id number]  HPI This patient presents to the office to discuss the toenails on both big toes. She says that we perform surgery by the permanent removal of the right great toenail approximately 7 months ago. She states that the nail has grown back and has become thick and painful at the tip of the toenail. She denies any drainage from the site. She also admits to pain along the inside border of the big toenail on her left foot. No redness, swelling or pain pain noted. She does have severe bunion deformities which lead to the poor  trans-furtherance of her weight during gait  Review of Systems     Objective:   Physical Exam GENERAL APPEARANCE: Alert, conversant. Appropriately groomed. No acute distress.  VASCULAR: Pedal pulses palpable at  [facility] and PT bilateral.  Capillary refill time is immediate to all digits,  Normal temperature gradient.  Digital hair growth is present bilateral  NEUROLOGIC: sensation is normal to 5.07 monofilament at 5/5 sites bilateral.  Light touch is intact bilateral, Muscle strength normal.  MUSCULOSKELETAL: acceptable muscle strength, tone and stability bilateral.  Intrinsic muscluature intact bilateral.  Rectus appearance of foot and digits noted bilateral. 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  No preulcerative lesions or ulcers  are seen, no interdigital maceration noted.  No open lesions present.  Digital nails are asymptomatic. No drainage noted. Thick hallux toenail right hallux.      Assessment:     Onychomycosis right hallux  Bunion B/L     Plan:     ROV  Discussed nails with patient.  Told her her pain was primarily caused by her bunios.  Treat right hallux toenail with dremel.  RTC prn   Helane Gunther DPM

## 2016-01-10 ENCOUNTER — Other Ambulatory Visit: Payer: BLUE CROSS/BLUE SHIELD

## 2016-01-11 ENCOUNTER — Other Ambulatory Visit: Payer: BLUE CROSS/BLUE SHIELD

## 2016-01-14 ENCOUNTER — Other Ambulatory Visit: Payer: BLUE CROSS/BLUE SHIELD

## 2016-01-18 ENCOUNTER — Encounter: Payer: BLUE CROSS/BLUE SHIELD | Admitting: Family Medicine

## 2016-01-27 ENCOUNTER — Other Ambulatory Visit (INDEPENDENT_AMBULATORY_CARE_PROVIDER_SITE_OTHER): Payer: Managed Care, Other (non HMO)

## 2016-01-27 DIAGNOSIS — Z0000 Encounter for general adult medical examination without abnormal findings: Secondary | ICD-10-CM | POA: Diagnosis not present

## 2016-01-27 DIAGNOSIS — E559 Vitamin D deficiency, unspecified: Secondary | ICD-10-CM | POA: Diagnosis not present

## 2016-01-27 LAB — CBC WITH DIFFERENTIAL/PLATELET
BASOS ABS: 0 10*3/uL (ref 0.0–0.1)
Basophils Relative: 0.7 % (ref 0.0–3.0)
EOS ABS: 0.1 10*3/uL (ref 0.0–0.7)
Eosinophils Relative: 2 % (ref 0.0–5.0)
HCT: 39.8 % (ref 36.0–46.0)
Hemoglobin: 13.7 g/dL (ref 12.0–15.0)
LYMPHS ABS: 2.2 10*3/uL (ref 0.7–4.0)
Lymphocytes Relative: 36.4 % (ref 12.0–46.0)
MCHC: 34.5 g/dL (ref 30.0–36.0)
MCV: 88.7 fl (ref 78.0–100.0)
MONOS PCT: 6.8 % (ref 3.0–12.0)
Monocytes Absolute: 0.4 10*3/uL (ref 0.1–1.0)
NEUTROS PCT: 54.1 % (ref 43.0–77.0)
Neutro Abs: 3.3 10*3/uL (ref 1.4–7.7)
Platelets: 217 10*3/uL (ref 150.0–400.0)
RBC: 4.49 Mil/uL (ref 3.87–5.11)
RDW: 12.9 % (ref 11.5–15.5)
WBC: 6.1 10*3/uL (ref 4.0–10.5)

## 2016-01-27 LAB — HEPATIC FUNCTION PANEL
ALBUMIN: 4 g/dL (ref 3.5–5.2)
ALK PHOS: 27 U/L — AB (ref 39–117)
ALT: 12 U/L (ref 0–35)
AST: 16 U/L (ref 0–37)
BILIRUBIN DIRECT: 0 mg/dL (ref 0.0–0.3)
BILIRUBIN TOTAL: 0.4 mg/dL (ref 0.2–1.2)
Total Protein: 6.7 g/dL (ref 6.0–8.3)

## 2016-01-27 LAB — LIPID PANEL
CHOL/HDL RATIO: 4
Cholesterol: 177 mg/dL (ref 0–200)
HDL: 49.8 mg/dL (ref >39.00)
LDL CALC: 114 mg/dL — AB (ref 0–99)
NONHDL: 126.87
Triglycerides: 62 mg/dL (ref 0.0–149.0)
VLDL: 12.4 mg/dL (ref 0.0–40.0)

## 2016-01-27 LAB — TSH: TSH: 1.43 u[IU]/mL (ref 0.35–4.50)

## 2016-01-27 LAB — POC URINALSYSI DIPSTICK (AUTOMATED)
BILIRUBIN UA: NEGATIVE
GLUCOSE UA: NEGATIVE
KETONES UA: NEGATIVE
Leukocytes, UA: NEGATIVE
NITRITE UA: NEGATIVE
Protein, UA: NEGATIVE
SPEC GRAV UA: 1.02
Urobilinogen, UA: 0.2
pH, UA: 6.5

## 2016-01-27 LAB — BASIC METABOLIC PANEL
BUN: 12 mg/dL (ref 6–23)
CALCIUM: 8.7 mg/dL (ref 8.4–10.5)
CO2: 24 mEq/L (ref 19–32)
CREATININE: 0.81 mg/dL (ref 0.40–1.20)
Chloride: 106 mEq/L (ref 96–112)
GFR: 81.52 mL/min (ref >60.00)
GLUCOSE: 95 mg/dL (ref 70–99)
Potassium: 3.8 mEq/L (ref 3.5–5.1)
SODIUM: 138 meq/L (ref 135–145)

## 2016-01-27 LAB — VITAMIN D 25 HYDROXY (VIT D DEFICIENCY, FRACTURES): VITD: 19.18 ng/mL — ABNORMAL LOW (ref 30.00–100.00)

## 2016-02-01 ENCOUNTER — Ambulatory Visit (INDEPENDENT_AMBULATORY_CARE_PROVIDER_SITE_OTHER): Payer: Managed Care, Other (non HMO) | Admitting: Family Medicine

## 2016-02-01 ENCOUNTER — Encounter: Payer: Self-pay | Admitting: Family Medicine

## 2016-02-01 VITALS — BP 96/75 | HR 79 | Temp 98.7°F | Ht 62.0 in | Wt 126.0 lb

## 2016-02-01 DIAGNOSIS — Z0000 Encounter for general adult medical examination without abnormal findings: Secondary | ICD-10-CM | POA: Diagnosis not present

## 2016-02-01 MED ORDER — VITAMIN D (ERGOCALCIFEROL) 1.25 MG (50000 UNIT) PO CAPS: 50000.0000 [IU] | ORAL_CAPSULE | ORAL | 11 refills | Status: DC

## 2016-02-01 MED ORDER — LIDOCAINE 5 % EX PTCH: 1.0000 | MEDICATED_PATCH | CUTANEOUS | 5 refills | Status: DC

## 2016-02-01 NOTE — Progress Notes (Signed)
Pre visit review using our clinic review tool, if applicable. No additional management support is needed unless otherwise documented below in the visit note. 

## 2016-02-01 NOTE — Progress Notes (Signed)
   Subjective:    Patient ID: Alison GullyNicole Chang, female    DOB: 09/22/1970, 45 y.o.   MRN: [id number]  HPI 45 yr old female for a well exam. She is doing well in general. She has some endometriosis pain at times prior to her menses, and she sees Dr. Billy Coastaavon for this. She works out regularly and her low back pain flares up at times. She gets very nice relief from this by using Lidoderm patches.    Review of Systems  Constitutional: Negative.   HENT: Negative.   Eyes: Negative.   Respiratory: Negative.   Cardiovascular: Negative.   Gastrointestinal: Negative.   Genitourinary: Negative for decreased urine volume, difficulty urinating, dyspareunia, dysuria, enuresis, flank pain, frequency, hematuria, pelvic pain and urgency.  Musculoskeletal: Negative.   Skin: Negative.   Neurological: Negative.   Psychiatric/Behavioral: Negative.        Objective:   Physical Exam  Constitutional: She is oriented to person, place, and time. She appears well-developed and well-nourished. No distress.  HENT:  Head: Normocephalic and atraumatic.  Right Ear: External ear normal.  Left Ear: External ear normal.  Nose: Nose normal.  Mouth/Throat: Oropharynx is clear and moist. No oropharyngeal exudate.  Eyes: Conjunctivae and EOM are normal. Pupils are equal, round, and reactive to light. No scleral icterus.  Neck: Normal range of motion. Neck supple. No JVD present. No thyromegaly present.  Cardiovascular: Normal rate, regular rhythm, normal heart sounds and intact distal pulses.  Exam reveals no gallop and no friction rub.   No murmur heard. Pulmonary/Chest: Effort normal and breath sounds normal. No respiratory distress. She has no wheezes. She has no rales. She exhibits no tenderness.  Abdominal: Soft. Bowel sounds are normal. She exhibits no distension and no mass. There is no tenderness. There is no rebound and no guarding.  Musculoskeletal: Normal range of motion. She exhibits no edema or tenderness.    Lymphadenopathy:    She has no cervical adenopathy.  Neurological: She is alert and oriented to person, place, and time. She has normal reflexes. No cranial nerve deficit. She exhibits normal muscle tone. Coordination normal.  Skin: Skin is warm and dry. No rash noted. No erythema.  Psychiatric: She has a normal mood and affect. Her behavior is normal. Judgment and thought content normal.          Assessment & Plan:  Well exam. We discussed diet and exercise. Use Lidoderm patches prn.  Nelwyn SalisburyFRY,Darel Ricketts A, MD

## 2016-10-25 ENCOUNTER — Encounter: Payer: Self-pay | Admitting: Family Medicine

## 2016-10-25 ENCOUNTER — Ambulatory Visit (INDEPENDENT_AMBULATORY_CARE_PROVIDER_SITE_OTHER): Payer: Managed Care, Other (non HMO) | Admitting: Family Medicine

## 2016-10-25 VITALS — BP 98/60 | HR 80 | Temp 99.0°F | Ht 62.0 in | Wt 123.0 lb

## 2016-10-25 DIAGNOSIS — M26659 Arthropathy of unspecified temporomandibular joint: Secondary | ICD-10-CM

## 2016-10-25 DIAGNOSIS — E559 Vitamin D deficiency, unspecified: Secondary | ICD-10-CM

## 2016-10-25 DIAGNOSIS — M26609 Unspecified temporomandibular joint disorder, unspecified side: Secondary | ICD-10-CM

## 2016-10-25 LAB — VITAMIN D 25 HYDROXY (VIT D DEFICIENCY, FRACTURES): VITD: 21.05 ng/mL — AB (ref 30.00–100.00)

## 2016-10-25 NOTE — Progress Notes (Signed)
   Subjective:    Patient ID: [name], female    DOB: [birth year], 46 y.o.   MRN: [id number]  HPI Here for 2 weeks of mild dull left ear pain. No fever or ST or sinus congestion. She chews gum frequently. She started taking vitamin D capsules last summer and she wants to recheck this. She admits to not taking this regularly.    Review of Systems  Constitutional: Negative.   HENT: Positive for ear pain. Negative for congestion, ear discharge, hearing loss, postnasal drip, sinus pain, sinus pressure, sore throat and tinnitus.   Eyes: Negative.   Respiratory: Negative.        Objective:   Physical Exam  Constitutional: She appears well-developed and well-nourished.  HENT:  Right Ear: External ear normal.  Left Ear: External ear normal.  Nose: Nose normal.  Mouth/Throat: Oropharynx is clear and moist.  The left TMJ is quite tender, no crepitus, full ROM   Eyes: Conjunctivae are normal.  Neck: Neck supple. No thyromegaly present.  Pulmonary/Chest: Effort normal and breath sounds normal. No respiratory distress. She has no wheezes. She has no rales.  Lymphadenopathy:    She has no cervical adenopathy.          Assessment & Plan:  TMJ pain. I advised her to rest the jaw as much as possible and to avoid chewing gum. Take 2 Aleve tablets bid for a week or two. If this does not help she should see her dentist ti check her bite alignment. Check a vitamin D level today.  Gershon Crane, MD

## 2016-10-25 NOTE — Patient Instructions (Signed)
WE NOW OFFER   [facility] FAST TRACK!!!  SAME DAY Appointments for ACUTE CARE  Such as: Sprains, Injuries, cuts, abrasions, rashes, muscle pain, joint pain, back pain Colds, flu, sore throats, headache, allergies, cough, fever  Ear pain, sinus and eye infections Abdominal pain, nausea, vomiting, diarrhea, upset stomach Animal/insect bites  3 Easy Ways to Schedule: Walk-In Scheduling Call in scheduling Mychart Sign-up: [email or web address]         

## 2017-02-15 ENCOUNTER — Encounter: Payer: Self-pay | Admitting: Family Medicine

## 2017-02-20 ENCOUNTER — Other Ambulatory Visit: Payer: Self-pay | Admitting: Family Medicine

## 2017-02-20 NOTE — Telephone Encounter (Signed)
Can we refill this? 

## 2017-12-07 ENCOUNTER — Ambulatory Visit: Payer: Managed Care, Other (non HMO) | Admitting: Family Medicine

## 2018-01-11 ENCOUNTER — Ambulatory Visit (INDEPENDENT_AMBULATORY_CARE_PROVIDER_SITE_OTHER): Payer: Managed Care, Other (non HMO) | Admitting: Family Medicine

## 2018-01-11 ENCOUNTER — Encounter: Payer: Self-pay | Admitting: Family Medicine

## 2018-01-11 VITALS — BP 108/70 | HR 80 | Temp 98.2°F | Ht 62.0 in | Wt 123.6 lb

## 2018-01-11 DIAGNOSIS — J0191 Acute recurrent sinusitis, unspecified: Secondary | ICD-10-CM

## 2018-01-11 MED ORDER — VITAMIN D (ERGOCALCIFEROL) 1.25 MG (50000 UNIT) PO CAPS: 50000.0000 [IU] | ORAL_CAPSULE | ORAL | 3 refills | Status: DC

## 2018-01-11 MED ORDER — AMOXICILLIN-POT CLAVULANATE 875-125 MG PO TABS
1.0000 | ORAL_TABLET | Freq: Two times a day (BID) | ORAL | 0 refills | Status: DC
Start: 2018-01-11 — End: 2018-02-06

## 2018-01-11 NOTE — Progress Notes (Signed)
   Subjective:    Patient ID: [name], female    DOB: 1970/08/08, 47 y.o.   MRN: [id number]  HPI Here for recurrent sinus symptoms. One month ago while in [place] she developed sinus congestion, headache, PND, and a dry cough. She went to a local Urgent Care and was given a 10 day course of Augmentin. She only took this for 6 days however because she quickly started feeling better. However over the past week all the same symptoms have returned. No fever. On Flonase and Mucinex.    Review of Systems  Constitutional: Negative.   HENT: Positive for congestion, postnasal drip, sinus pressure and sinus pain. Negative for sore throat.   Eyes: Negative.   Respiratory: Positive for cough.        Objective:   Physical Exam  Constitutional: She appears well-developed and well-nourished.  Cardiovascular: Normal rate, regular rhythm, normal heart sounds and intact distal pulses.  Pulmonary/Chest: Effort normal and breath sounds normal. No stridor. No respiratory distress. She has no wheezes. She has no rales.          Assessment & Plan:  Recurrent sinusitis. Treat with Augmentin again but this time she assured me she will take the full 10 day course.  Gershon Crane, MD

## 2018-02-06 ENCOUNTER — Encounter: Payer: Self-pay | Admitting: Family Medicine

## 2018-02-06 ENCOUNTER — Ambulatory Visit (INDEPENDENT_AMBULATORY_CARE_PROVIDER_SITE_OTHER): Payer: Managed Care, Other (non HMO) | Admitting: Family Medicine

## 2018-02-06 VITALS — BP 88/60 | HR 77 | Temp 98.4°F | Wt 122.3 lb

## 2018-02-06 DIAGNOSIS — R490 Dysphonia: Secondary | ICD-10-CM | POA: Diagnosis not present

## 2018-02-06 NOTE — Progress Notes (Signed)
   Subjective:    Patient ID: [name], female    DOB: 25-Feb-1971, 47 y.o.   MRN: [id number]  HPI Here to discuss some recent health issues. She was here on 01-11-18 for a sinus infection and this cleared up nicely with Augmentin. However since then she has been mildly fatigued, and her voice has been hoarse. 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 She saw Dr. Serena Colonel yesterday and had a laryngoscopy performed. This was  Unremarkable and her vocal cords appeared normal. He thought she may have some GERD and suggested some dietary changes she could make. She denies any heartburn or indigestion. No recent cough or fever. She has a slight ST and she takes Zyrtec daily for allergies.    Review of Systems  Constitutional: Negative.   HENT: Positive for sore throat and voice change. Negative for congestion, postnasal drip, sinus pressure, sinus pain, sneezing and trouble swallowing.   Eyes: Negative.   Respiratory: Negative.        Objective:   Physical Exam  Constitutional: She appears well-developed and well-nourished.  HENT:  Right Ear: External ear normal.  Left Ear: External ear normal.  Nose: Nose normal.  Mouth/Throat: Oropharynx is clear and moist.  Eyes: Conjunctivae are normal.  Neck: No thyromegaly present.  Pulmonary/Chest: Effort normal and breath sounds normal.  Lymphadenopathy:    She has no cervical adenopathy.          Assessment & Plan:  Hoarseness, which is likely the result of vocal cord fatigue. I suggested she cut back on her singing for a month or two to allow them to rest. In case she has some silent reflux, she will try Prilosec OTC for a month. She will return here in one month for a well exam, and we will follow up on this at that time.  Gershon Crane, MD

## 2018-03-11 ENCOUNTER — Encounter: Payer: Self-pay | Admitting: Family Medicine

## 2018-03-11 ENCOUNTER — Ambulatory Visit (INDEPENDENT_AMBULATORY_CARE_PROVIDER_SITE_OTHER): Payer: Managed Care, Other (non HMO) | Admitting: Family Medicine

## 2018-03-11 VITALS — BP 102/60 | HR 76 | Temp 98.4°F | Ht 62.5 in | Wt 124.2 lb

## 2018-03-11 DIAGNOSIS — E559 Vitamin D deficiency, unspecified: Secondary | ICD-10-CM

## 2018-03-11 DIAGNOSIS — Z8349 Family history of other endocrine, nutritional and metabolic diseases: Secondary | ICD-10-CM | POA: Diagnosis not present

## 2018-03-11 DIAGNOSIS — Z23 Encounter for immunization: Secondary | ICD-10-CM | POA: Diagnosis not present

## 2018-03-11 DIAGNOSIS — Z0000 Encounter for general adult medical examination without abnormal findings: Secondary | ICD-10-CM | POA: Diagnosis not present

## 2018-03-11 LAB — BASIC METABOLIC PANEL
BUN: 14 mg/dL (ref 6–23)
CALCIUM: 8.9 mg/dL (ref 8.4–10.5)
CHLORIDE: 105 meq/L (ref 96–112)
CO2: 30 meq/L (ref 19–32)
CREATININE: 0.66 mg/dL (ref 0.40–1.20)
GFR: 102.28 mL/min (ref >60.00)
GLUCOSE: 89 mg/dL (ref 70–99)
Potassium: 4.1 mEq/L (ref 3.5–5.1)
Sodium: 138 mEq/L (ref 135–145)

## 2018-03-11 LAB — POC URINALSYSI DIPSTICK (AUTOMATED)
Bilirubin, UA: NEGATIVE
Blood, UA: NEGATIVE
GLUCOSE UA: NEGATIVE
KETONES UA: NEGATIVE
Leukocytes, UA: NEGATIVE
Nitrite, UA: NEGATIVE
PROTEIN UA: NEGATIVE
UROBILINOGEN UA: 0.2 U/dL
pH, UA: 6 (ref 5.0–8.0)

## 2018-03-11 LAB — HEPATIC FUNCTION PANEL
ALBUMIN: 4 g/dL (ref 3.5–5.2)
ALT: 8 U/L (ref 0–35)
AST: 11 U/L (ref 0–37)
Alkaline Phosphatase: 28 U/L — ABNORMAL LOW (ref 39–117)
BILIRUBIN DIRECT: 0 mg/dL (ref 0.0–0.3)
TOTAL PROTEIN: 6.2 g/dL (ref 6.0–8.3)
Total Bilirubin: 0.2 mg/dL (ref 0.2–1.2)

## 2018-03-11 LAB — CBC WITH DIFFERENTIAL/PLATELET
BASOS ABS: 0.1 10*3/uL (ref 0.0–0.1)
BASOS PCT: 1.7 % (ref 0.0–3.0)
EOS PCT: 1.8 % (ref 0.0–5.0)
Eosinophils Absolute: 0.1 10*3/uL (ref 0.0–0.7)
HEMATOCRIT: 39.3 % (ref 36.0–46.0)
Hemoglobin: 13.2 g/dL (ref 12.0–15.0)
Lymphocytes Relative: 36.3 % (ref 12.0–46.0)
Lymphs Abs: 1.9 10*3/uL (ref 0.7–4.0)
MCHC: 33.5 g/dL (ref 30.0–36.0)
MCV: 89.3 fl (ref 78.0–100.0)
Monocytes Absolute: 0.4 10*3/uL (ref 0.1–1.0)
Monocytes Relative: 7.3 % (ref 3.0–12.0)
Neutro Abs: 2.8 10*3/uL (ref 1.4–7.7)
Neutrophils Relative %: 52.9 % (ref 43.0–77.0)
Platelets: 201 10*3/uL (ref 150.0–400.0)
RBC: 4.41 Mil/uL (ref 3.87–5.11)
RDW: 13.1 % (ref 11.5–15.5)
WBC: 5.3 10*3/uL (ref 4.0–10.5)

## 2018-03-11 LAB — LIPID PANEL
CHOLESTEROL: 167 mg/dL (ref 0–200)
HDL: 52 mg/dL (ref >39.00)
LDL Cholesterol: 102 mg/dL — ABNORMAL HIGH (ref 0–99)
NonHDL: 114.7
TRIGLYCERIDES: 62 mg/dL (ref 0.0–149.0)
Total CHOL/HDL Ratio: 3
VLDL: 12.4 mg/dL (ref 0.0–40.0)

## 2018-03-11 LAB — T4, FREE: Free T4: 0.87 ng/dL (ref 0.60–1.60)

## 2018-03-11 LAB — TSH: TSH: 1.21 u[IU]/mL (ref 0.35–4.50)

## 2018-03-11 LAB — T3, FREE: T3, Free: 2.9 pg/mL (ref 2.3–4.2)

## 2018-03-11 LAB — VITAMIN D 25 HYDROXY (VIT D DEFICIENCY, FRACTURES): VITD: 22.92 ng/mL — AB (ref 30.00–100.00)

## 2018-03-11 NOTE — Progress Notes (Signed)
   Subjective:    Patient ID: [name], female    DOB: August 10, 1970, 47 y.o.   MRN: [id number]  HPI Here for a well exam. She feels great. She took Prilosec for a few weeks and her hoarseness resolved.    Review of Systems  Constitutional: Negative.   HENT: Negative.   Eyes: Negative.   Respiratory: Negative.   Cardiovascular: Negative.   Gastrointestinal: Negative.   Genitourinary: Negative for decreased urine volume, difficulty urinating, dyspareunia, dysuria, enuresis, flank pain, frequency, hematuria, pelvic pain and urgency.  Musculoskeletal: Negative.   Skin: Negative.   Neurological: Negative.   Psychiatric/Behavioral: Negative.        Objective:   Physical Exam  Constitutional: She is oriented to person, place, and time. She appears well-developed and well-nourished. No distress.  HENT:  Head: Normocephalic and atraumatic.  Right Ear: External ear normal.  Left Ear: External ear normal.  Nose: Nose normal.  Mouth/Throat: Oropharynx is clear and moist. No oropharyngeal exudate.  Eyes: Pupils are equal, round, and reactive to light. Conjunctivae and EOM are normal. No scleral icterus.  Neck: Normal range of motion. Neck supple. No JVD present. No thyromegaly present.  Cardiovascular: Normal rate, regular rhythm, normal heart sounds and intact distal pulses. Exam reveals no gallop and no friction rub.  No murmur heard. Pulmonary/Chest: Effort normal and breath sounds normal. No respiratory distress. She has no wheezes. She has no rales. She exhibits no tenderness.  Abdominal: Soft. Bowel sounds are normal. She exhibits no distension and no mass. There is no tenderness. There is no rebound and no guarding.  Musculoskeletal: Normal range of motion. She exhibits no edema or tenderness.  Lymphadenopathy:    She has no cervical adenopathy.  Neurological: She is alert and oriented to person, place, and time. She has normal reflexes. She displays normal reflexes. No cranial  nerve deficit. She exhibits normal muscle tone. Coordination normal.  Skin: Skin is warm and dry. No rash noted. No erythema.  Psychiatric: She has a normal mood and affect. Her behavior is normal. Judgment and thought content normal.          Assessment & Plan:  Well exam. We discussed diet and exercise. Get fasting labs.  Gershon Crane, MD

## 2018-03-13 ENCOUNTER — Encounter: Payer: Self-pay | Admitting: *Deleted

## 2018-03-31 ENCOUNTER — Encounter: Payer: Self-pay | Admitting: Family Medicine

## 2018-04-02 NOTE — Telephone Encounter (Signed)
Dr. Fry please advise. Thanks  

## 2018-04-05 NOTE — Telephone Encounter (Signed)
Her cholesterol levels are excellent. Just eat a healthy diet

## 2018-04-16 ENCOUNTER — Encounter: Payer: Self-pay | Admitting: Family Medicine

## 2018-04-16 ENCOUNTER — Ambulatory Visit (INDEPENDENT_AMBULATORY_CARE_PROVIDER_SITE_OTHER): Payer: Managed Care, Other (non HMO) | Admitting: Family Medicine

## 2018-04-16 VITALS — BP 100/60 | HR 71 | Temp 99.2°F | Wt 127.1 lb

## 2018-04-16 DIAGNOSIS — G629 Polyneuropathy, unspecified: Secondary | ICD-10-CM | POA: Diagnosis not present

## 2018-04-16 DIAGNOSIS — H1012 Acute atopic conjunctivitis, left eye: Secondary | ICD-10-CM

## 2018-04-16 NOTE — Progress Notes (Signed)
   Subjective:    Patient ID: [name], female    DOB: 07-31-1970, 47 y.o.   MRN: [id number]  HPI Here for 2 issues. First for the past 6 days she has had continuous tingling and mild numbness in both hands and the right leg and foot. No weakness or pain. She has never felt this before. No change in medications. She had a complete well exam last month with complete labs that were normal except a low vitamin D. She is concerned about a possible nerve disease because she has a strong family hx of autoimmune conditions like multiple sclerosis and Graves disease. Also yesterday she had some redness and some tearing in the left eye. No DC or matting. She used a few drops of Bacitracin she had at home and now today the eye feels fine.    Review of Systems  Constitutional: Negative.   HENT: Negative.   Eyes: Positive for redness. Negative for discharge and visual disturbance.  Respiratory: Negative.   Neurological: Positive for numbness. Negative for dizziness, tremors, seizures, syncope, facial asymmetry, speech difficulty, weakness, light-headedness and headaches.       Objective:   Physical Exam  Constitutional: She is oriented to person, place, and time. She appears well-developed and well-nourished.  HENT:  Right Ear: External ear normal.  Left Ear: External ear normal.  Nose: Nose normal.  Mouth/Throat: Oropharynx is clear and moist.  Eyes: Pupils are equal, round, and reactive to light. Conjunctivae and EOM are normal.  Neck: No thyromegaly present.  Cardiovascular: Normal rate, regular rhythm, normal heart sounds and intact distal pulses.  Pulmonary/Chest: Effort normal and breath sounds normal.  Musculoskeletal: She exhibits no edema.  Lymphadenopathy:    She has no cervical adenopathy.  Neurological: She is alert and oriented to person, place, and time. She displays normal reflexes. No cranial nerve deficit or sensory deficit. She exhibits normal muscle tone. Coordination  normal.  Skin: Skin is warm. No erythema.          Assessment & Plan:  Sensory neuropathy of uncertain etiology. We will check levels of magnesium and vitamin B12 today. I suggested she begin taking a B complex vitamin daily. Her eye irritation appears to have been allergic in nature and today it has resolved.  Gershon Crane, MD

## 2018-04-17 ENCOUNTER — Encounter: Payer: Self-pay | Admitting: Family Medicine

## 2018-04-17 DIAGNOSIS — G629 Polyneuropathy, unspecified: Secondary | ICD-10-CM

## 2018-04-17 LAB — VITAMIN B12: Vitamin B-12: 266 pg/mL (ref 211–911)

## 2018-04-17 LAB — MAGNESIUM: MAGNESIUM: 2.2 mg/dL (ref 1.5–2.5)

## 2018-04-18 ENCOUNTER — Encounter: Payer: Self-pay | Admitting: Family Medicine

## 2018-04-18 ENCOUNTER — Encounter: Payer: Self-pay | Admitting: Neurology

## 2018-04-18 NOTE — Telephone Encounter (Signed)
Dr. Fry please advise. Thanks  

## 2018-04-18 NOTE — Telephone Encounter (Signed)
The referral was done  

## 2018-04-19 ENCOUNTER — Encounter: Payer: Self-pay | Admitting: *Deleted

## 2018-04-19 NOTE — Telephone Encounter (Signed)
I changed the referral to Dr. Epimenio Foot

## 2018-04-19 NOTE — Telephone Encounter (Signed)
Dr Fry please advise. thanks 

## 2018-04-19 NOTE — Addendum Note (Signed)
Addended by: Gershon Crane A on: 04/19/2018 09:19 AM   Modules accepted: Orders

## 2018-04-30 ENCOUNTER — Ambulatory Visit (INDEPENDENT_AMBULATORY_CARE_PROVIDER_SITE_OTHER): Payer: Managed Care, Other (non HMO) | Admitting: Family Medicine

## 2018-04-30 ENCOUNTER — Encounter: Payer: Self-pay | Admitting: Family Medicine

## 2018-04-30 VITALS — BP 118/60 | HR 63 | Temp 98.8°F | Wt 128.4 lb

## 2018-04-30 DIAGNOSIS — G629 Polyneuropathy, unspecified: Secondary | ICD-10-CM

## 2018-04-30 NOTE — Progress Notes (Signed)
   Subjective:    Patient ID: [name], female    DOB: 04/04/1971, 47 y.o.   MRN: [id number]  HPI Here to follow up on neuropathy. She suddenly developed numbness in both hands and the right leg about 3 weeks ago. This has progressed slightly since then and she has also noticed some slight weakness in both hands and in both legs. No double vision. We did labwork recently which was normal although her B12 level was at the low end of normal at 266. She is scheduled to see Dr. Richard Sater on 06-28-18 for a neurological consultation. She is very concerned and has been researching this on the Internet. She wants to do some more studies between now and when she sees Neurology.    Review of Systems  Constitutional: Negative.   Respiratory: Negative.   Cardiovascular: Negative.   Neurological: Positive for weakness and numbness. Negative for dizziness, tremors, seizures, syncope, facial asymmetry, speech difficulty, light-headedness and headaches.       Objective:   Physical Exam  Constitutional: She is oriented to person, place, and time. She appears well-developed and well-nourished.  Cardiovascular: Normal rate, regular rhythm, normal heart sounds and intact distal pulses.  Pulmonary/Chest: Effort normal and breath sounds normal.  Neurological: She is alert and oriented to person, place, and time. No cranial nerve deficit. She exhibits normal muscle tone. Coordination normal.          Assessment & Plan:  Peripheral neuropathy. Today we will repeat a CBC and a B12, also get a ESR and CRP. We will set up nerve conduction studies in both arms. We discussed the idea of giving her a steroid taper to see if her symptoms improved, but we agreed to delay this until after the NCS were performed.  Stephen Fry, MD  

## 2018-05-02 ENCOUNTER — Other Ambulatory Visit: Payer: Managed Care, Other (non HMO)

## 2018-05-02 LAB — CBC WITH DIFFERENTIAL/PLATELET
BASOS ABS: 0.1 10*3/uL (ref 0.0–0.1)
BASOS PCT: 1.2 % (ref 0.0–3.0)
EOS ABS: 0.1 10*3/uL (ref 0.0–0.7)
Eosinophils Relative: 1.6 % (ref 0.0–5.0)
HEMATOCRIT: 38.3 % (ref 36.0–46.0)
HEMOGLOBIN: 12.9 g/dL (ref 12.0–15.0)
Lymphocytes Relative: 31.4 % (ref 12.0–46.0)
Lymphs Abs: 1.8 10*3/uL (ref 0.7–4.0)
MCHC: 33.7 g/dL (ref 30.0–36.0)
MCV: 88 fl (ref 78.0–100.0)
MONO ABS: 0.4 10*3/uL (ref 0.1–1.0)
Monocytes Relative: 6.9 % (ref 3.0–12.0)
Neutro Abs: 3.4 10*3/uL (ref 1.4–7.7)
Neutrophils Relative %: 58.9 % (ref 43.0–77.0)
Platelets: 219 10*3/uL (ref 150.0–400.0)
RBC: 4.35 Mil/uL (ref 3.87–5.11)
RDW: 12.9 % (ref 11.5–15.5)
WBC: 5.8 10*3/uL (ref 4.0–10.5)

## 2018-05-02 LAB — SEDIMENTATION RATE: SED RATE: 6 mm/h (ref 0–20)

## 2018-05-02 LAB — C-REACTIVE PROTEIN

## 2018-05-02 LAB — VITAMIN B12: VITAMIN B 12: 336 pg/mL (ref 211–911)

## 2018-05-20 ENCOUNTER — Encounter: Payer: Self-pay | Admitting: Family Medicine

## 2018-05-20 DIAGNOSIS — M79642 Pain in left hand: Principal | ICD-10-CM

## 2018-05-20 DIAGNOSIS — M79641 Pain in right hand: Secondary | ICD-10-CM

## 2018-05-20 NOTE — Telephone Encounter (Signed)
I went ahead and did a referral to Rheumatology as well

## 2018-06-03 ENCOUNTER — Encounter: Payer: Self-pay | Admitting: Family Medicine

## 2018-06-10 ENCOUNTER — Ambulatory Visit (INDEPENDENT_AMBULATORY_CARE_PROVIDER_SITE_OTHER): Payer: Managed Care, Other (non HMO) | Admitting: Neurology

## 2018-06-10 ENCOUNTER — Encounter: Payer: Self-pay | Admitting: Neurology

## 2018-06-10 ENCOUNTER — Telehealth: Payer: Self-pay | Admitting: Neurology

## 2018-06-10 DIAGNOSIS — R29898 Other symptoms and signs involving the musculoskeletal system: Secondary | ICD-10-CM | POA: Diagnosis not present

## 2018-06-10 DIAGNOSIS — R29818 Other symptoms and signs involving the nervous system: Secondary | ICD-10-CM | POA: Diagnosis not present

## 2018-06-10 DIAGNOSIS — R200 Anesthesia of skin: Secondary | ICD-10-CM | POA: Diagnosis not present

## 2018-06-10 NOTE — Telephone Encounter (Signed)
MR Brain w/wo contrast & MR Cervical spine w/wo contrast Dr. Tommy Rainwater Auth: [id number] (exp. 06/10/18 to 09/08/18). Patient is scheduled at [facility] for 06/12/18

## 2018-06-10 NOTE — Progress Notes (Signed)
[facility]  PATIENT: [name] DOB: June 16, 1970  REFERRING DOCTOR OR PCP:  Alysia Penna SOURCE: patient, notes from Dr. Sarajane Jews  _________________________________   HISTORICAL  CHIEF COMPLAINT:  Chief Complaint  Patient presents with  . New Patient (Initial Visit)    RM 13, alone. Internal referral for neuropathy. Requesting EMG/NCS. She saw Rheumatology last week who did some blood work and sonogram on wrists. Pt signed record release form today in our office to get those records  . Numbness    Pt reports sx that started 03/2018. She is having tingling in both hands and in right lower leg. She has a "buzzing" when she tilts head forward. She will get shocking sensation into her hands sometimes as well. Her brother has MS. No other family history of MS. She did have right thigh weakness and some right foot drop when sx first started in November    HISTORY OF PRESENT ILLNESS:  I had the pleasure of seeing your patient, [name], at the [place] [facility] at [facility] for a neurologic consultation regarding her numbness.  She is a 48 year old woman who had the onset of numbness in both hands with a tingling sensation in mid November 2019.  The tingling is uncomfortable at times but not painful.    She also had numbness in her right foot and ankle.  The left is not noticeably numb but may feel slightly different.    There was mild weakness in her right thigh --- had trouble doing lunges exercise.     Symptoms built up over a day or two and have been mosty constant since then but is maybe better over the last week or two.   She has a Lhermitte sign which was worse in November than now.  She reports an electric-like buzzing sensation when she flexes her neck.  She has also noted more fatigue.    She had no bladder or bowel issues.     She denied any gait issues.    She denies any new visual problems though she feels she needs new glasses as vision mildly  more blurry bilaterally.     Fatigue is still present but better than last month.    She sleeps ok but some nights does not get enough sleep.    Mood is doing well.   She denies any cognitive issues.     She saw rheumatology and labwork was fine.  Vitamin B12, TSH, ESR were normal.  She had a wrist nerve sonogram and was told that the one nerve was slightly enlarged.  She is otherwise in good health.   She has seasonal allergies.   She has no autoimmune disorders.   Her brother has active secondary progressive MS, diagnosed with MS 4-5 years ago but symptomatic for many more years.Marland Kitchen  REVIEW OF SYSTEMS: Constitutional: No fevers, chills, sweats, or change in appetite Eyes: No visual changes, double vision, eye pain Ear, nose and throat: No hearing loss, ear pain, nasal congestion, sore throat Cardiovascular: No chest pain, palpitations Respiratory: No shortness of breath at rest or with exertion.   No wheezes GastrointestinaI: No nausea, vomiting, diarrhea, abdominal pain, fecal incontinence Genitourinary: No dysuria, urinary retention or frequency.  No nocturia. Musculoskeletal: No neck pain, back pain Integumentary: No rash, pruritus, skin lesions Neurological: as above Psychiatric: No depression at this time.  No anxiety Endocrine: No palpitations, diaphoresis, change in appetite, change in weigh or increased thirst Hematologic/Lymphatic: No anemia, purpura, petechiae. Allergic/Immunologic: No  itchy/runny eyes, nasal congestion, recent allergic reactions, rashes  ALLERGIES: Allergies  Allergen Reactions  . Amoxicillin Diarrhea  . Clarithromycin Rash  . Erythromycin Base Rash  . Moxifloxacin Swelling and Anxiety    lips swell    HOME MEDICATIONS:  Current Outpatient Medications:  .  cetirizine (ZYRTEC) 10 MG tablet, Take 10 mg by mouth daily., Disp: , Rfl:  .  ibuprofen (ADVIL) 200 MG tablet, Take 4 tablets (800 mg total) by mouth every 8 (eight) hours as needed. (Patient  taking differently: Take 200 mg by mouth every 8 (eight) hours as needed. ), Disp: 30 tablet, Rfl: 0 .  lidocaine (LIDODERM) 5 %, Place 1 patch onto the skin daily. Remove & Discard patch within 12 hours or as directed by MD, Disp: 30 patch, Rfl: 5 .  omeprazole (PRILOSEC) 20 MG capsule, Take 20 mg by mouth daily., Disp: , Rfl:  .  Vitamin D, Ergocalciferol, (DRISDOL) 50000 units CAPS capsule, Take 1 capsule (50,000 Units total) by mouth every 7 (seven) days., Disp: 12 capsule, Rfl: 3  PAST MEDICAL HISTORY: Past Medical History:  Diagnosis Date  . Actinic dermatitis    sees Dr. Rozann Lesches   . Allergy   . Endometriosis   . Low back pain   . Palpitations   . Routine gynecological examination    sees Dr. Ronita Hipps   . Seasonal allergies     PAST SURGICAL HISTORY: Past Surgical History:  Procedure Laterality Date  . dysplasia     cervical ablation  . KNEE ARTHROSCOPY    . LAPAROSCOPY N/A 12/12/2013   Procedure: LAPAROSCOPY DIAGNOSTIC;  Surgeon: Margarette Asal, MD;  Location: [facility];  Service: Gynecology;  Laterality: N/A;  . lumbar ESI     Dr Joya Salm    FAMILY HISTORY: Family History  Problem Relation Age of Onset  . Hyperthyroidism Mother        Graves   . Graves' disease Mother   . Coronary artery disease Father   . Cirrhosis Father   . Multiple sclerosis Brother     SOCIAL HISTORY:  Social History   Socioeconomic History  . Marital status: Married    Spouse name: Not on file  . Number of children: 2  . Years of education: BA  . Highest education level: Not on file  Occupational History  . Occupation: Works from home  Social Needs  . Financial resource strain: Not on file  . Food insecurity:    Worry: Not on file    Inability: Not on file  . Transportation needs:    Medical: Not on file    Non-medical: Not on file  Tobacco Use  . Smoking status: Former Research scientist (life sciences)  . Smokeless tobacco: Never Used  Substance and Sexual Activity  . Alcohol use: Yes     Alcohol/week: 0.0 standard drinks    Comment: Has 1 drink per day  . Drug use: No  . Sexual activity: Not on file  Lifestyle  . Physical activity:    Days per week: Not on file    Minutes per session: Not on file  . Stress: Not on file  Relationships  . Social connections:    Talks on phone: Not on file    Gets together: Not on file    Attends religious service: Not on file    Active member of club or organization: Not on file    Attends meetings of clubs or organizations: Not on file    Relationship status: Not on file  .  Intimate partner violence:    Fear of current or ex partner: Not on file    Emotionally abused: Not on file    Physically abused: Not on file    Forced sexual activity: Not on file  Other Topics Concern  . Not on file  Social History Narrative   Lives with husband   Caffeine use: 1-2 cups per day   Left handed      PHYSICAL EXAM  Vitals:   06/10/18 1021  BP: 118/71  Pulse: 83  Weight: 128 lb 8 oz (58.3 kg)  Height: _0  (1.575 m)    Body mass index is 23.5 kg/m.   General: The patient is well-developed and well-nourished and in no acute distress  Eyes:  Funduscopic exam shows normal optic discs and retinal vessels.  Neck: The neck is supple, no carotid bruits are noted.  The neck is nontender.  Cardiovascular: The heart has a regular rate and rhythm with a normal S1 and S2. There were no murmurs, gallops or rubs. Lungs are clear to auscultation.  Skin: Extremities are without significant edema.  Musculoskeletal:  Back is nontender  Neurologic Exam  Mental status: The patient is alert and oriented x 3 at the time of the examination. The patient has apparent normal recent and remote memory, with an apparently normal attention span and concentration ability.   Speech is normal.  Cranial nerves: Extraocular movements are full. Pupils are equal, round, and reactive to light and accomodation.  Visual fields are full.  Facial symmetry is  present. There is good facial sensation to soft touch bilaterally.Facial strength is normal.  Trapezius and sternocleidomastoid strength is normal. No dysarthria is noted.  The tongue is midline, and the patient has symmetric elevation of the soft palate. No obvious hearing deficits are noted.  Motor:  Muscle bulk is normal.   Tone is normal. Strength is  5 / 5 in all 4 extremities.   Sensory: She gets an electric-like sensation down her spine when she flexes her neck.  She has reduced sensation to touch and temperature in the hands relative to the shoulders.  She also has reduced sensation to touch and temperature in the right leg relative to the left leg.  Vibration sensation was more symmetric.  Coordination: Cerebellar testing reveals good finger-nose-finger and heel-to-shin bilaterally.  Gait and station: Station is normal.   Gait is normal. Tandem gait is normal. Romberg is negative.   Reflexes: Deep tendon reflexes are symmetric and 3 bilaterally.   Plantar responses are flexor.    DIAGNOSTIC DATA (LABS, IMAGING, TESTING) - I reviewed patient records, labs, notes, testing and imaging myself where available.  Lab Results  Component Value Date   WBC 5.8 05/02/2018   HGB 12.9 05/02/2018   HCT 38.3 05/02/2018   MCV 88.0 05/02/2018   PLT 219.0 05/02/2018      Component Value Date/Time   NA 138 03/11/2018 0934   K 4.1 03/11/2018 0934   CL 105 03/11/2018 0934   CO2 30 03/11/2018 0934   GLUCOSE 89 03/11/2018 0934   BUN 14 03/11/2018 0934   CREATININE 0.66 03/11/2018 0934   CALCIUM 8.9 03/11/2018 0934   PROT 6.2 03/11/2018 0934   ALBUMIN 4.0 03/11/2018 0934   AST 11 03/11/2018 0934   ALT 8 03/11/2018 0934   ALKPHOS 28 (L) 03/11/2018 0934   BILITOT 0.2 03/11/2018 0934   GFRNONAA >60 12/07/2010 1030   GFRAA >60 12/07/2010 1030   Lab Results  Component Value  Date   CHOL 167 03/11/2018   HDL 52.00 03/11/2018   LDLCALC 102 (H) 03/11/2018   TRIG 62.0 03/11/2018   CHOLHDL 3  03/11/2018   No results found for: HGBA1C Lab Results  Component Value Date   VITAMINB12 336 05/02/2018   Lab Results  Component Value Date   TSH 1.21 03/11/2018       ASSESSMENT AND PLAN  Numbness - Plan: MR CERVICAL SPINE W WO CONTRAST, MR BRAIN W WO CONTRAST  Lhermitte's sign positive - Plan: MR CERVICAL SPINE W WO CONTRAST, MR BRAIN W WO CONTRAST  Weakness of right foot - Plan: MR CERVICAL SPINE W WO CONTRAST, MR BRAIN W WO CONTRAST   In summary, [name] is a 48 year old woman who developed numbness in both arms and the right leg 2 months ago.  She also noted some weakness which has improved.  She also has a Lhermitte sign that is worrisome for either a demyelinating transverse myelitis or a compressive myelopathy.  Furthermore, she has a brother with MS.   Therefore, I am most concerned that her new symptoms represent the initial clinical exacerbation associated with multiple sclerosis.  We need to check an MRI of the brain and cervical spine with and without contrast to determine if her symptoms do to a cervical demyelinating transverse myelitis or compressive myelopathy and also further evaluate for demyelination and other CNS processes.    I am less concerned about a polyneuropathy but we will need to check an NCV/EMG if the MRI is abnormal.  She is already scheduled but I will cancel that exam if the MRIs are more consistent with MS or other CNS process.  Either way, I will see her back next week to discuss the findings of the MRIs and for further evaluation as needed.  She should call back sooner if new or worsening neurologic symptoms.  Thank you for asking me to see [name].  Please let me know if I can be of further assistance with her or other patients in the future.   Theadora Noyes A. Felecia Shelling, MD, [facility] 6/70/1410, 30:13 AM Certified in Neurology, Clinical Neurophysiology, Sleep Medicine, Pain Medicine and Neuroimaging  [facility] [address] [place], [place] [address] [phone]

## 2018-06-12 ENCOUNTER — Telehealth: Payer: Self-pay | Admitting: Neurology

## 2018-06-12 ENCOUNTER — Ambulatory Visit: Payer: Managed Care, Other (non HMO)

## 2018-06-12 DIAGNOSIS — R29898 Other symptoms and signs involving the musculoskeletal system: Secondary | ICD-10-CM

## 2018-06-12 DIAGNOSIS — R29818 Other symptoms and signs involving the nervous system: Secondary | ICD-10-CM | POA: Diagnosis not present

## 2018-06-12 DIAGNOSIS — R200 Anesthesia of skin: Secondary | ICD-10-CM

## 2018-06-12 MED ORDER — GADOBENATE DIMEGLUMINE 529 MG/ML IV SOLN
12.0000 mL | Freq: Once | INTRAVENOUS | Status: AC | PRN
  Administered 2018-06-12: 12 mL via INTRAVENOUS

## 2018-06-12 NOTE — Telephone Encounter (Signed)
Dr. Clent Ridges: I spoke to [name] about her MRI results.  The MRI of the cervical spine does show a plaque just below the cervical medullary junction in the posterior spinal cord very consistent with a multiple sclerosis lesion.  The MRI of the brain shows about 10-12 small foci, 1 of which enhanced.  These are all in the hemispheres.  Since they are small they would not be expected to be symptomatic.  I discussed with her that the combination of her symptoms and the MRI showing both acute and chronic lesions make [place] certain of a diagnosis of multiple sclerosis.  Therefore, we can cancel the nerve conduction study/EMG and use that time slot to go over disease modifying therapies.  [place] leaves [place] be out of the country [place] about 2 weeks next Wednesday and we will try to get her started on the medication as soon as she returns.  Thank you for referring her to the MS center at [facility].  [name] A. Epimenio Foot, MD, PhD, FAAN Certified in Neurology, Clinical Neurophysiology, Sleep Medicine and Neuroimaging Director, Multiple Sclerosis Center at [facility]

## 2018-06-18 ENCOUNTER — Telehealth: Payer: Self-pay | Admitting: *Deleted

## 2018-06-18 ENCOUNTER — Ambulatory Visit (INDEPENDENT_AMBULATORY_CARE_PROVIDER_SITE_OTHER): Payer: Managed Care, Other (non HMO) | Admitting: Neurology

## 2018-06-18 ENCOUNTER — Encounter: Payer: Self-pay | Admitting: Neurology

## 2018-06-18 VITALS — BP 110/60 | HR 76 | Ht 62.0 in | Wt 124.5 lb

## 2018-06-18 DIAGNOSIS — R200 Anesthesia of skin: Secondary | ICD-10-CM | POA: Diagnosis not present

## 2018-06-18 DIAGNOSIS — R29818 Other symptoms and signs involving the nervous system: Secondary | ICD-10-CM | POA: Diagnosis not present

## 2018-06-18 DIAGNOSIS — Z79899 Other long term (current) drug therapy: Secondary | ICD-10-CM | POA: Diagnosis not present

## 2018-06-18 DIAGNOSIS — G35 Multiple sclerosis: Secondary | ICD-10-CM

## 2018-06-18 NOTE — Telephone Encounter (Signed)
Placed [facility] lab in [facility] lock box for routine lab pick up.  

## 2018-06-18 NOTE — Progress Notes (Signed)
[facility]  PATIENT: [name] DOB: 05-Jun-1970  REFERRING DOCTOR OR PCP:  Alysia Penna SOURCE: patient, notes from Dr. Sarajane Jews  _________________________________   HISTORICAL  CHIEF COMPLAINT:  Chief Complaint  Patient presents with  . Follow-up    RM 12 with husband, Gershon Mussel. Last seen 06/10/2018. Here to discuss MS DMT options.    HISTORY OF PRESENT ILLNESS:  [name] 48 year old woman diagnosed with MS after her 06/10/2018 visit when she presented with numbness  Update 06/18/2018: After the last visit, she had an MRI of the brain that showed multiple small T2/flair hyperintense foci in the periventricular and deep white matter.  There was one focus in the left frontal lobe near the caudate nucleus that enhanced after contrast consistent with a subacute focus.   The MRI of the cervical spine showed a T2 hyperintense focus adjacent to C1-C2 posteriorly that likely cause her numbness.  The combination of the brain and cervical spine MRI confirmed the diagnosis of multiple sclerosis and she meets the McDonald criteria.  CSF analysis would not be needed.  She continues to note numbness in her hands but otherwise feels normal.  She has a Lhermitte sign frequently.  We had a detailed discussion about therapies (see plan below)   From 06/10/2018: She is a 48 year old woman who had the onset of numbness in both hands with a tingling sensation in mid November 2019.  The tingling is uncomfortable at times but not painful.    She also had numbness in her right foot and ankle.  The left is not noticeably numb but may feel slightly different.    There was mild weakness in her right thigh --- had trouble doing lunges exercise.     Symptoms built up over a day or two and have been mosty constant since then but is maybe better over the last week or two.   She has a Lhermitte sign which was worse in November than now.  She reports an electric-like buzzing sensation when she  flexes her neck.  She has also noted more fatigue.    She had no bladder or bowel issues.     She denied any gait issues.    She denies any new visual problems though she feels she needs new glasses as vision mildly more blurry bilaterally.     Fatigue is still present but better than last month.    She sleeps ok but some nights does not get enough sleep.    Mood is doing well.   She denies any cognitive issues.     She saw rheumatology and labwork was fine.  Vitamin B12, TSH, ESR were normal.  She had a wrist nerve sonogram and was told that the one nerve was slightly enlarged.  She is otherwise in good health.   She has seasonal allergies.   She has no autoimmune disorders.   Her brother has active secondary progressive MS, diagnosed with MS 4-5 years ago but symptomatic for many more years.Marland Kitchen  REVIEW OF SYSTEMS: Constitutional: No fevers, chills, sweats, or change in appetite Eyes: No visual changes, double vision, eye pain Ear, nose and throat: No hearing loss, ear pain, nasal congestion, sore throat Cardiovascular: No chest pain, palpitations Respiratory: No shortness of breath at rest or with exertion.   No wheezes GastrointestinaI: No nausea, vomiting, diarrhea, abdominal pain, fecal incontinence Genitourinary: No dysuria, urinary retention or frequency.  No nocturia. Musculoskeletal: No neck pain, back pain Integumentary: No rash, pruritus, skin lesions Neurological:  as above Psychiatric: No depression at this time.  No anxiety Endocrine: No palpitations, diaphoresis, change in appetite, change in weigh or increased thirst Hematologic/Lymphatic: No anemia, purpura, petechiae. Allergic/Immunologic: No itchy/runny eyes, nasal congestion, recent allergic reactions, rashes  ALLERGIES: Allergies  Allergen Reactions  . Amoxicillin Diarrhea  . Clarithromycin Rash  . Erythromycin Base Rash  . Moxifloxacin Swelling and Anxiety    lips swell    HOME MEDICATIONS:  Current  Outpatient Medications:  .  cetirizine (ZYRTEC) 10 MG tablet, Take 10 mg by mouth daily., Disp: , Rfl:  .  ibuprofen (ADVIL) 200 MG tablet, Take 4 tablets (800 mg total) by mouth every 8 (eight) hours as needed. (Patient taking differently: Take 200 mg by mouth every 8 (eight) hours as needed. ), Disp: 30 tablet, Rfl: 0 .  lidocaine (LIDODERM) 5 %, Place 1 patch onto the skin daily. Remove & Discard patch within 12 hours or as directed by MD, Disp: 30 patch, Rfl: 5 .  omeprazole (PRILOSEC) 20 MG capsule, Take 20 mg by mouth daily., Disp: , Rfl:  .  Vitamin D, Ergocalciferol, (DRISDOL) 50000 units CAPS capsule, Take 1 capsule (50,000 Units total) by mouth every 7 (seven) days., Disp: 12 capsule, Rfl: 3  PAST MEDICAL HISTORY: Past Medical History:  Diagnosis Date  . Actinic dermatitis    sees Dr. Rozann Lesches   . Allergy   . Endometriosis   . Low back pain   . Palpitations   . Routine gynecological examination    sees Dr. Ronita Hipps   . Seasonal allergies     PAST SURGICAL HISTORY: Past Surgical History:  Procedure Laterality Date  . dysplasia     cervical ablation  . KNEE ARTHROSCOPY    . LAPAROSCOPY N/A 12/12/2013   Procedure: LAPAROSCOPY DIAGNOSTIC;  Surgeon: Margarette Asal, MD;  Location: [facility];  Service: Gynecology;  Laterality: N/A;  . lumbar ESI     Dr Joya Salm    FAMILY HISTORY: Family History  Problem Relation Age of Onset  . Hyperthyroidism Mother        Graves   . Graves' disease Mother   . Coronary artery disease Father   . Cirrhosis Father   . Multiple sclerosis Brother     SOCIAL HISTORY:  Social History   Socioeconomic History  . Marital status: Married    Spouse name: Not on file  . Number of children: 2  . Years of education: BA  . Highest education level: Not on file  Occupational History  . Occupation: Works from home  Social Needs  . Financial resource strain: Not on file  . Food insecurity:    Worry: Not on file    Inability: Not on file  .  Transportation needs:    Medical: Not on file    Non-medical: Not on file  Tobacco Use  . Smoking status: Former Research scientist (life sciences)  . Smokeless tobacco: Never Used  Substance and Sexual Activity  . Alcohol use: Yes    Alcohol/week: 0.0 standard drinks    Comment: Has 1 drink per day  . Drug use: No  . Sexual activity: Not on file  Lifestyle  . Physical activity:    Days per week: Not on file    Minutes per session: Not on file  . Stress: Not on file  Relationships  . Social connections:    Talks on phone: Not on file    Gets together: Not on file    Attends religious service: Not on file  Active member of club or organization: Not on file    Attends meetings of clubs or organizations: Not on file    Relationship status: Not on file  . Intimate partner violence:    Fear of current or ex partner: Not on file    Emotionally abused: Not on file    Physically abused: Not on file    Forced sexual activity: Not on file  Other Topics Concern  . Not on file  Social History Narrative   Lives with husband   Caffeine use: 1-2 cups per day   Left handed      PHYSICAL EXAM  Vitals:   06/18/18 0952  BP: 110/60  Pulse: 76  SpO2: 97%  Weight: 124 lb 8 oz (56.5 kg)  Height: 5' 2" (1.575 m)    Body mass index is 22.77 kg/m.   General: The patient is well-developed and well-nourished and in no acute distress   Neurologic Exam  Mental status: The patient is alert and oriented x 3 at the time of the examination. The patient has apparent normal recent and remote memory, with an apparently normal attention span and concentration ability.   Speech is normal.  Cranial nerves: Eye movements are normal.  Facial strength is normal..  The tongue is midline, and the patient has symmetric elevation of the soft palate. No obvious hearing deficits are noted.  Motor:  Muscle bulk is normal.   Tone is normal. Strength is  5 / 5 in all 4 extremities.   Sensory: She gets an electric-like sensation  down her spine when she flexes her neck.  She has reduced sensation to touch and temperature in the hands relative to the shoulders.  She also has reduced sensation to touch and temperature in the right leg relative to the left leg.  Vibration sensation was more symmetric.  Coordination: Cerebellar testing reveals good finger-nose-finger and heel-to-shin bilaterally.  Gait and station: Station is normal.  The gait is normal.  Tandem gait is normal.  Romberg is negative.  Reflexes: Deep tendon reflexes are symmetric and 3 bilaterally.       DIAGNOSTIC DATA (LABS, IMAGING, TESTING) - I reviewed patient records, labs, notes, testing and imaging myself where available.  Lab Results  Component Value Date   WBC 5.8 05/02/2018   HGB 12.9 05/02/2018   HCT 38.3 05/02/2018   MCV 88.0 05/02/2018   PLT 219.0 05/02/2018      Component Value Date/Time   NA 138 03/11/2018 0934   K 4.1 03/11/2018 0934   CL 105 03/11/2018 0934   CO2 30 03/11/2018 0934   GLUCOSE 89 03/11/2018 0934   BUN 14 03/11/2018 0934   CREATININE 0.66 03/11/2018 0934   CALCIUM 8.9 03/11/2018 0934   PROT 6.2 03/11/2018 0934   ALBUMIN 4.0 03/11/2018 0934   AST 11 03/11/2018 0934   ALT 8 03/11/2018 0934   ALKPHOS 28 (L) 03/11/2018 0934   BILITOT 0.2 03/11/2018 0934   GFRNONAA >60 12/07/2010 1030   GFRAA >60 12/07/2010 1030   Lab Results  Component Value Date   CHOL 167 03/11/2018   HDL 52.00 03/11/2018   LDLCALC 102 (H) 03/11/2018   TRIG 62.0 03/11/2018   CHOLHDL 3 03/11/2018   No results found for: HGBA1C Lab Results  Component Value Date   VITAMINB12 336 05/02/2018   Lab Results  Component Value Date   TSH 1.21 03/11/2018       ASSESSMENT AND PLAN  Multiple sclerosis ([place]) - Plan: Stratify  JCV Antibody Test ([facility]), Hepatitis B core antibody, total, Hepatitis B surface antigen, HIV Antibody (routine testing w rflx), QuantiFERON-TB Gold Plus, CBC with Differential/Platelet, Comprehensive metabolic  panel, Hepatitis C antibody, Hepatitis B surface antibody,qualitative, Cytochrome P450 2C9 Genotyping, Varicella zoster antibody, IgG  High risk medication use - Plan: Stratify JCV Antibody Test ([facility]), Hepatitis B core antibody, total, Hepatitis B surface antigen, HIV Antibody (routine testing w rflx), QuantiFERON-TB Gold Plus, CBC with Differential/Platelet, Comprehensive metabolic panel, Hepatitis C antibody, Hepatitis B surface antibody,qualitative, Cytochrome P450 2C9 Genotyping, Varicella zoster antibody, IgG  Numbness  Lhermitte's sign positive   We had a 55-minute face-to-face discussion about her diagnosis of MS, prognosis, exacerbation, symptoms and disease modifying therapies.  In detail, I discussed several disease modifying therapies including Vumerity, Mayzent, Tysabri and Ocrevus.  I discussed that because she has a spinal cord plaque her level of aggressiveness is likely average and I would prefer her to be on a moderate to strong efficacious medication.  We spent less time discussing the injectable therapies as they appear to be less efficacious.   I also showed her and her husband the MRI of the brain and spine performed last week showing a lesion at C2 that likely cause her symptoms and scattered lesions in the brain consistent with MS including one that enhanced consistent with a more subacute focus.  She meets the McDonald criteria.  We will check blood work to determine JCV antibody status, rule out chronic infections (hepatitis tuberculosis) and other labs.   We also discussed trying to stay active and exercising and diet.  Although there is no "MS diet", a Mediterranean diet is generally healthy and easy to follow.  I will call her back next week when we get the lab results and we will decide on the medication.  She will call us back sooner if she has new or worsening neurologic symptoms.   Richard A. Felecia Shelling, MD, [facility] 9/50/9326, 7:12 PM Certified in Neurology, Clinical  Neurophysiology, Sleep Medicine, Pain Medicine and Neuroimaging  [facility] [address] [place], [place] [address] [phone]

## 2018-06-24 ENCOUNTER — Telehealth: Payer: Self-pay | Admitting: Neurology

## 2018-06-24 NOTE — Telephone Encounter (Signed)
The 2C9 genetic test and the JCV AB titer are still pending (both usually take about 6-8 days

## 2018-06-24 NOTE — Telephone Encounter (Signed)
Pt is requesting a call back with her lab results. Please advise.

## 2018-06-24 NOTE — Telephone Encounter (Signed)
I called pt back and relayed message. Called was dropped afterwards on her end.

## 2018-06-24 NOTE — Telephone Encounter (Signed)
Dr. Epimenio Foot- do you have her lab results? I am happy to call her, thanks

## 2018-06-24 NOTE — Telephone Encounter (Signed)
Called pt again. Spoke with her for a minute and then she dropped the call again

## 2018-06-24 NOTE — Telephone Encounter (Signed)
Pt called back. I relayed results. She verbalized understanding. Advised we will call once JCV and 2C9 genetic test back. At that time, we will determine what DMT we will place her on. She will have to come and sign start form. She verbalized understanding.

## 2018-06-25 LAB — CYTOCHROME P450 2C9 GENOTYPING

## 2018-06-25 LAB — CBC WITH DIFFERENTIAL/PLATELET
Basophils Absolute: 0.1 10*3/uL (ref 0.0–0.2)
Basos: 1 %
EOS (ABSOLUTE): 0.1 10*3/uL (ref 0.0–0.4)
Eos: 2 %
Hematocrit: 39.5 % (ref 34.0–46.6)
Hemoglobin: 13.2 g/dL (ref 11.1–15.9)
IMMATURE GRANS (ABS): 0 10*3/uL (ref 0.0–0.1)
Immature Granulocytes: 0 %
Lymphocytes Absolute: 1.8 10*3/uL (ref 0.7–3.1)
Lymphs: 35 %
MCH: 29.4 pg (ref 26.6–33.0)
MCHC: 33.4 g/dL (ref 31.5–35.7)
MCV: 88 fL (ref 79–97)
Monocytes Absolute: 0.3 10*3/uL (ref 0.1–0.9)
Monocytes: 6 %
Neutrophils Absolute: 2.9 10*3/uL (ref 1.4–7.0)
Neutrophils: 56 %
PLATELETS: 204 10*3/uL (ref 150–450)
RBC: 4.49 x10E6/uL (ref 3.77–5.28)
RDW: 12.9 % (ref 11.7–15.4)
WBC: 5.2 10*3/uL (ref 3.4–10.8)

## 2018-06-25 LAB — QUANTIFERON-TB GOLD PLUS
QUANTIFERON TB1 AG VALUE: 0.03 [IU]/mL
QUANTIFERON-TB GOLD PLUS: NEGATIVE
QuantiFERON Mitogen Value: 8.78 IU/mL
QuantiFERON Nil Value: 0.02 IU/mL
QuantiFERON TB2 Ag Value: 0.02 IU/mL

## 2018-06-25 LAB — COMPREHENSIVE METABOLIC PANEL
ALT: 8 IU/L (ref 0–32)
AST: 19 IU/L (ref 0–40)
Albumin/Globulin Ratio: 1.6 (ref 1.2–2.2)
Albumin: 4.4 g/dL (ref 3.8–4.8)
Alkaline Phosphatase: 36 IU/L — ABNORMAL LOW (ref 39–117)
BUN/Creatinine Ratio: 14 (ref 9–23)
BUN: 11 mg/dL (ref 6–24)
Bilirubin Total: 0.3 mg/dL (ref 0.0–1.2)
CO2: 22 mmol/L (ref 20–29)
Calcium: 8.8 mg/dL (ref 8.7–10.2)
Chloride: 102 mmol/L (ref 96–106)
Creatinine, Ser: 0.77 mg/dL (ref 0.57–1.00)
GFR calc Af Amer: 107 mL/min/{1.73_m2} (ref >59)
GFR calc non Af Amer: 93 mL/min/{1.73_m2} (ref >59)
Globulin, Total: 2.8 g/dL (ref 1.5–4.5)
Glucose: 80 mg/dL (ref 65–99)
Potassium: 4.3 mmol/L (ref 3.5–5.2)
Sodium: 139 mmol/L (ref 134–144)
TOTAL PROTEIN: 7.2 g/dL (ref 6.0–8.5)

## 2018-06-25 LAB — VARICELLA ZOSTER ANTIBODY, IGG: Varicella zoster IgG: 2259 index (ref >165)

## 2018-06-25 LAB — HIV ANTIBODY (ROUTINE TESTING W REFLEX): HIV Screen 4th Generation wRfx: NONREACTIVE

## 2018-06-25 LAB — HEPATITIS B SURFACE ANTIBODY,QUALITATIVE: Hep B Surface Ab, Qual: NONREACTIVE

## 2018-06-25 LAB — HEPATITIS B CORE ANTIBODY, TOTAL: HEP B C TOTAL AB: NEGATIVE

## 2018-06-25 LAB — HEPATITIS C ANTIBODY: Hep C Virus Ab: <0.1 s/co ratio (ref 0.0–0.9)

## 2018-06-25 LAB — HEPATITIS B SURFACE ANTIGEN: Hepatitis B Surface Ag: NEGATIVE

## 2018-06-26 NOTE — Telephone Encounter (Signed)
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 Gave to Dr. Epimenio FootSater to review.

## 2018-06-27 ENCOUNTER — Telehealth: Payer: Self-pay | Admitting: Neurology

## 2018-06-27 ENCOUNTER — Encounter: Payer: Self-pay | Admitting: Family Medicine

## 2018-06-27 DIAGNOSIS — E559 Vitamin D deficiency, unspecified: Secondary | ICD-10-CM

## 2018-06-27 NOTE — Telephone Encounter (Addendum)
I called pt back. She is fighting a cold. She is having some left eye discomfort. This started around the time she got the cold. Reports eye is swollen some and has some redness. Denies discharge from eye, pain, or any vision loss. She has not taken OTC meds to see if this will help with sx. I recommended she try this. I will speak with Dr. Epimenio Foot to make sure he does not want to do anything further. I will call if he does. She will continue to monitor and call back if sx worsen.   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 Tysabri would not be a good option per Dr. Epimenio Foot. She would like to go with Vumerity. She will come today to sign the form. Provided pt information with form for her to take home with her.

## 2018-06-27 NOTE — Telephone Encounter (Signed)
Have her make a lab appt for another vitamin D level soon. Depending on the result, I may prescribe the vitamin D capsules again but double the dosing to one capsule twice a week. I will put in the order.

## 2018-06-27 NOTE — Telephone Encounter (Signed)
Dr. Fry please advise. Thanks  

## 2018-06-27 NOTE — Telephone Encounter (Signed)
Spoke with Dr. Epimenio Foot- he does not feel eye sx r/t MS. She should f/u with eye doctor if sx do not improve

## 2018-06-27 NOTE — Telephone Encounter (Signed)
Received fax notification from [employer] that they received start form and they are processing.

## 2018-06-27 NOTE — Telephone Encounter (Signed)
Faxed completed/signed Vumerity start form to [employer] at [phone]. Received fax confirmation.  

## 2018-06-27 NOTE — Telephone Encounter (Signed)
Pt would like an update on her lab work and would also like advise on what to do with the discomfort that she is feeling in her L eye that is swollen. She does not know if she should go to an eye doctor or if this has to do with her MS. Please advise.

## 2018-06-27 NOTE — Telephone Encounter (Signed)
I spoke with patient while she came in to sign Vumerity start form. Relayed message below about her eye. She verbalized understanding and appreciation.

## 2018-06-28 ENCOUNTER — Ambulatory Visit: Payer: BLUE CROSS/BLUE SHIELD | Admitting: Neurology

## 2018-07-01 ENCOUNTER — Telehealth: Payer: Self-pay | Admitting: *Deleted

## 2018-07-01 ENCOUNTER — Other Ambulatory Visit: Payer: Self-pay | Admitting: *Deleted

## 2018-07-01 ENCOUNTER — Other Ambulatory Visit (INDEPENDENT_AMBULATORY_CARE_PROVIDER_SITE_OTHER): Payer: Managed Care, Other (non HMO)

## 2018-07-01 ENCOUNTER — Ambulatory Visit: Payer: Managed Care, Other (non HMO) | Admitting: Neurology

## 2018-07-01 DIAGNOSIS — E559 Vitamin D deficiency, unspecified: Secondary | ICD-10-CM | POA: Diagnosis not present

## 2018-07-01 LAB — VITAMIN D 25 HYDROXY (VIT D DEFICIENCY, FRACTURES): VITD: 46.18 ng/mL (ref 30.00–100.00)

## 2018-07-01 NOTE — Telephone Encounter (Signed)
I called [employer] to check on status of Vumerity start form. They stat rx plan exclusion. They will see if pt qualifies for free drug program. She will also let her case manager know and will contact the pt.

## 2018-07-03 ENCOUNTER — Other Ambulatory Visit: Payer: Self-pay | Admitting: Family Medicine

## 2018-07-03 ENCOUNTER — Encounter: Payer: Self-pay | Admitting: *Deleted

## 2018-07-03 MED ORDER — VITAMIN D (ERGOCALCIFEROL) 1.25 MG (50000 UNIT) PO CAPS: 50000.0000 [IU] | ORAL_CAPSULE | ORAL | 3 refills | Status: DC

## 2018-07-09 ENCOUNTER — Telehealth: Payer: Self-pay | Admitting: *Deleted

## 2018-07-09 NOTE — Telephone Encounter (Signed)
LMTC.  Pt. was to start Vumerity for MS, however it is a plan exclusion with her ins. policy. [employer] has been unable to approve her for the Vumerity free drug program, and there is no timeline as to if/when she would be approved for free drug. As she is currently not on a dmt, Dr. Epimenio Foot does not want to wait any longer--would like to start Tecfidera.  Pt. needs to come in and sign an SRF/fim

## 2018-07-09 NOTE — Telephone Encounter (Signed)
Per RAS, Ocrevus still appropriate for pt. and if she would prefer to start this, we will work on getting it approved.  When I let her know this, she clarified that she started Vumerity (quick start program) last wk. I lmom (identified vm) that per RAS, she may continue Vumerity until we get Ocrevus approved for her, then stop, as she will need to be off of Vumerity for 4-6 wks. prior to first Ocrevus infusion/fim

## 2018-07-09 NOTE — Telephone Encounter (Signed)
Spoke with Domino and explained we still do not have an answer regarding potential approval for Vumerity free drug program. She verbalized understanding of same. Sts. she has been having 2nd thoughts regarding dmt most appropriate for her MS. Sts. that given her brother's more severe MS, and the fact that she has a spinal cord lesion, she has been considering Ocrevus (can't start Tysabri due to positive JCV ab). Would like to know if Dr. Epimenio Foot would still approve this option for her.  I will check and call pt. back/fim

## 2018-07-10 NOTE — Telephone Encounter (Signed)
Vumerity PA submitted to [employer]. Received fax back stating Vumerity is a plan exclusion. I called [employer] (phone# [phone] was told I can try to submit an appeal.  Urgent appeal submitted by phone. Pt. has no tried and faileds. Physician rx'ing Vumerity because he felt it would better initially treay her more aggressive MS. Ref# [id number]./OIB

## 2018-07-11 NOTE — Telephone Encounter (Signed)
Received fax from [employer] stating Vumerity is a plan exclusion for pt.; will not be approved thru appeal. Case# [id number]. PA and appeal denial letters faxed to [employer], fax# [phone], with request that pt. be enrolled into the free drug program for Vumerity/fim

## 2018-07-17 ENCOUNTER — Other Ambulatory Visit: Payer: Self-pay | Admitting: *Deleted

## 2018-07-17 MED ORDER — DIROXIMEL FUMARATE 231 MG PO CPDR: 2.0000 | DELAYED_RELEASE_CAPSULE | Freq: Two times a day (BID) | ORAL | 11 refills | Status: DC

## 2018-07-25 ENCOUNTER — Telehealth: Payer: Self-pay | Admitting: *Deleted

## 2018-07-25 NOTE — Telephone Encounter (Signed)
I received fax notification from [employer] that Vumerity has been shipped to pt via free drug program. Phone: [phone]. Fax: [phone].

## 2018-08-12 ENCOUNTER — Other Ambulatory Visit: Payer: Self-pay | Admitting: Neurology

## 2018-08-12 DIAGNOSIS — G35 Multiple sclerosis: Secondary | ICD-10-CM

## 2018-08-14 ENCOUNTER — Telehealth: Payer: Self-pay | Admitting: Neurology

## 2018-08-14 NOTE — Telephone Encounter (Signed)
I called pt back. Verified lab orders in. She can come by our office to complete at her convenience. She will come first thing tomorrow morning. I placed her on lab schedule. She verbalized understanding and appreciation.

## 2018-08-14 NOTE — Telephone Encounter (Signed)
Pt is asking for a call from RN to discuss the suggested blood work for her taking Vumerity .  Pt states she was told Dr Epimenio Foot was putting in an order but has not been contacted.  Please call

## 2018-08-15 ENCOUNTER — Other Ambulatory Visit (INDEPENDENT_AMBULATORY_CARE_PROVIDER_SITE_OTHER): Payer: Self-pay

## 2018-08-15 ENCOUNTER — Other Ambulatory Visit: Payer: Self-pay

## 2018-08-15 DIAGNOSIS — G35 Multiple sclerosis: Secondary | ICD-10-CM

## 2018-08-15 DIAGNOSIS — Z0289 Encounter for other administrative examinations: Secondary | ICD-10-CM

## 2018-08-16 LAB — CBC WITH DIFFERENTIAL/PLATELET
Basophils Absolute: 0.1 10*3/uL (ref 0.0–0.2)
Basos: 1 %
EOS (ABSOLUTE): 0.2 10*3/uL (ref 0.0–0.4)
Eos: 3 %
Hematocrit: 41.3 % (ref 34.0–46.6)
Hemoglobin: 13.8 g/dL (ref 11.1–15.9)
Immature Grans (Abs): 0 10*3/uL (ref 0.0–0.1)
Immature Granulocytes: 0 %
LYMPHS: 32 %
Lymphocytes Absolute: 1.6 10*3/uL (ref 0.7–3.1)
MCH: 30.1 pg (ref 26.6–33.0)
MCHC: 33.4 g/dL (ref 31.5–35.7)
MCV: 90 fL (ref 79–97)
Monocytes Absolute: 0.4 10*3/uL (ref 0.1–0.9)
Monocytes: 8 %
Neutrophils Absolute: 2.8 10*3/uL (ref 1.4–7.0)
Neutrophils: 56 %
PLATELETS: 171 10*3/uL (ref 150–450)
RBC: 4.59 x10E6/uL (ref 3.77–5.28)
RDW: 13.7 % (ref 11.7–15.4)
WBC: 5 10*3/uL (ref 3.4–10.8)

## 2018-08-16 LAB — HEPATIC FUNCTION PANEL
ALT: 42 IU/L — ABNORMAL HIGH (ref 0–32)
AST: 37 IU/L (ref 0–40)
Albumin: 4.6 g/dL (ref 3.8–4.8)
Alkaline Phosphatase: 34 IU/L — ABNORMAL LOW (ref 39–117)
Bilirubin Total: 0.3 mg/dL (ref 0.0–1.2)
Bilirubin, Direct: 0.11 mg/dL (ref 0.00–0.40)
Total Protein: 6.7 g/dL (ref 6.0–8.5)

## 2018-10-10 ENCOUNTER — Encounter: Payer: Self-pay | Admitting: *Deleted

## 2018-10-14 ENCOUNTER — Encounter: Payer: Self-pay | Admitting: Neurology

## 2018-10-14 ENCOUNTER — Ambulatory Visit (INDEPENDENT_AMBULATORY_CARE_PROVIDER_SITE_OTHER): Payer: Managed Care, Other (non HMO) | Admitting: Neurology

## 2018-10-14 ENCOUNTER — Other Ambulatory Visit: Payer: Self-pay

## 2018-10-14 DIAGNOSIS — R29818 Other symptoms and signs involving the nervous system: Secondary | ICD-10-CM | POA: Diagnosis not present

## 2018-10-14 DIAGNOSIS — R29898 Other symptoms and signs involving the musculoskeletal system: Secondary | ICD-10-CM

## 2018-10-14 DIAGNOSIS — G35 Multiple sclerosis: Secondary | ICD-10-CM

## 2018-10-14 DIAGNOSIS — E559 Vitamin D deficiency, unspecified: Secondary | ICD-10-CM | POA: Diagnosis not present

## 2018-10-14 NOTE — Addendum Note (Signed)
Addended by: Asa Lente on: 10/14/2018 08:15 PM   Modules accepted: Orders

## 2018-10-14 NOTE — Progress Notes (Signed)
[facility]  PATIENT: [name] DOB: 02/24/1971  REFERRING DOCTOR OR PCP:  Alysia Penna SOURCE: patient, notes from Dr. Sarajane Jews  _________________________________   HISTORICAL  CHIEF COMPLAINT:  Chief Complaint  Patient presents with   Multiple Sclerosis    On Vumerity    HISTORY OF PRESENT ILLNESS:  [name] 48 y.o. woman diagnosed with MS after her 06/10/2018 visit when she presented with numbness  Update 10/14/2018: Virtual Visit via Video Note I connected with [name] on 10/14/18 at 10:00 AM EDT by a video enabled telemedicine application and verified that I am speaking with the correct person.  I discussed the limitations of evaluation and management by telemedicine and the availability of in person appointments. The patient expressed understanding and agreed to proceed.  History of Present Illness: She has been on Vumerity since February 2020 and has tolerated it well.   She has occasioanl mild flushing.   She never had GI issues.   Sh denies any new neurologic symptoms and no exacerbations.  Some days she has no symptoms but she will sometimes have right foot numbness.   She denies any recent Lhermitte sign (none x 1 month).   Gait is doing well.  She denies any weakness.  She currently does not have any numbness.  Bladder function is good.  She denies visual changes.  She does not have much difficulties with fatigue.  Data:   MRI of the brain and cervical spine 06/12/18 showed multiple small T2/flair hyperintense foci in the periventricular and deep white matter.  There was one focus in the left frontal lobe near the caudate nucleus that enhanced after contrast consistent with a subacute focus.   The MRI of the cervical spine showed a T2 hyperintense focus adjacent to C1-C2 posteriorly that likely cause her numbness.     Observations/Objective: She is a well-developed well-nourished woman in no acute distress.  The head is normocephalic and  atraumatic.  Sclera are anicteric.  Visible skin appears normal.  The neck has a good range of motion.  Pharynx and tongue have normal appearance.  She is alert and fully oriented with fluent speech and good attention, knowledge and memory.  Extraocular muscles are intact.  Facial strength is normal.  Palatal elevation and tongue protrusion are midline.  She appears to have normal strength in the arms.  Rapid alternating movements and finger-nose-finger are performed well.   Assessment and Plan: Lhermitte's sign positive  Multiple sclerosis (HCC)  Weakness of right foot  Vitamin D deficiency  1.  Continue Vumerity.  We will check a CBC with differential and liver function tests.  We need to check an MRI of the brain in several months to determine if she is having any subclinical progression.  If present, consider a change to a different disease modifying therapy. 2.   Continue vitamin D. 3.   Stay active and exercise as tolerated. 4.   Return to see me in 4 to 5 months or sooner if there are new or worsening neurologic symptoms.  Follow Up Instructions: I discussed the assessment and treatment plan with the patient. The patient was provided an opportunity to ask questions and all were answered. The patient agreed with the plan and demonstrated an understanding of the instructions.    The patient was advised to call back or seek an in-person evaluation if the symptoms worsen or if the condition fails to improve as anticipated.  I provided 25 minutes of non-face-to-face time during this  encounter. _________________________________ From previous visits: Update 06/18/2018: After the last visit, she had an MRI of the brain that showed multiple small T2/flair hyperintense foci in the periventricular and deep white matter.  There was one focus in the left frontal lobe near the caudate nucleus that enhanced after contrast consistent with a subacute focus.   The MRI of the cervical spine showed a T2  hyperintense focus adjacent to C1-C2 posteriorly that likely cause her numbness.  The combination of the brain and cervical spine MRI confirmed the diagnosis of multiple sclerosis and she meets the McDonald criteria.  CSF analysis would not be needed.  She continues to note numbness in her hands but otherwise feels normal.  She has a Lhermitte sign frequently.  We had a detailed discussion about therapies (see plan below)   From 06/10/2018: She is a 48 year old woman who had the onset of numbness in both hands with a tingling sensation in mid November 2019.  The tingling is uncomfortable at times but not painful.    She also had numbness in her right foot and ankle.  The left is not noticeably numb but may feel slightly different.    There was mild weakness in her right thigh --- had trouble doing lunges exercise.     Symptoms built up over a day or two and have been mosty constant since then but is maybe better over the last week or two.   She has a Lhermitte sign which was worse in November than now.  She reports an electric-like buzzing sensation when she flexes her neck.  She has also noted more fatigue.    She had no bladder or bowel issues.     She denied any gait issues.    She denies any new visual problems though she feels she needs new glasses as vision mildly more blurry bilaterally.     Fatigue is still present but better than last month.    She sleeps ok but some nights does not get enough sleep.    Mood is doing well.   She denies any cognitive issues.    She saw rheumatology and labwork was fine.  Vitamin B12, TSH, ESR were normal.  She had a wrist nerve sonogram and was told that the one nerve was slightly enlarged.  She is otherwise in good health.   She has seasonal allergies.   She has no autoimmune disorders.   Her brother has active secondary progressive MS, diagnosed with MS 4-5 years ago but symptomatic for many more years.Marland Kitchen  REVIEW OF SYSTEMS: Constitutional: No fevers, chills,  sweats, or change in appetite Eyes: No visual changes, double vision, eye pain Ear, nose and throat: No hearing loss, ear pain, nasal congestion, sore throat Cardiovascular: No chest pain, palpitations Respiratory: No shortness of breath at rest or with exertion.   No wheezes GastrointestinaI: No nausea, vomiting, diarrhea, abdominal pain, fecal incontinence Genitourinary: No dysuria, urinary retention or frequency.  No nocturia. Musculoskeletal: No neck pain, back pain Integumentary: No rash, pruritus, skin lesions Neurological: as above Psychiatric: No depression at this time.  No anxiety Endocrine: No palpitations, diaphoresis, change in appetite, change in weigh or increased thirst Hematologic/Lymphatic: No anemia, purpura, petechiae. Allergic/Immunologic: No itchy/runny eyes, nasal congestion, recent allergic reactions, rashes  ALLERGIES: Allergies  Allergen Reactions   Amoxicillin Diarrhea   Clarithromycin Rash   Erythromycin Base Rash   Moxifloxacin Swelling and Anxiety    lips swell    HOME MEDICATIONS:  Current Outpatient Medications:  Ascorbic Acid (VITAMIN C PO), Take by mouth., Disp: , Rfl:    B Complex Vitamins (VITAMIN-B COMPLEX) TABS, Take by mouth., Disp: , Rfl:    cetirizine (ZYRTEC) 10 MG tablet, Take 10 mg by mouth daily., Disp: , Rfl:    Diroximel Fumarate (VUMERITY) 231 MG CPDR, Take 2 capsules by mouth 2 (two) times daily., Disp: 120 capsule, Rfl: 11   ibuprofen (ADVIL) 200 MG tablet, Take 4 tablets (800 mg total) by mouth every 8 (eight) hours as needed. (Patient taking differently: Take 200 mg by mouth every 8 (eight) hours as needed. ), Disp: 30 tablet, Rfl: 0   lidocaine (LIDODERM) 5 %, Place 1 patch onto the skin daily. Remove & Discard patch within 12 hours or as directed by MD, Disp: 30 patch, Rfl: 5   Multiple Vitamins-Minerals (MULTIVITAMIN ADULT PO), Take by mouth., Disp: , Rfl:    Omega-3 Fatty Acids (OMEGA 3 PO), Take by mouth.,  Disp: , Rfl:    omeprazole (PRILOSEC) 20 MG capsule, Take 20 mg by mouth daily., Disp: , Rfl:    VITAMIN D PO, Take by mouth., Disp: , Rfl:    Vitamin D, Ergocalciferol, (DRISDOL) 1.25 MG (50000 UT) CAPS capsule, Take 1 capsule (50,000 Units total) by mouth every 7 (seven) days., Disp: 12 capsule, Rfl: 3  PAST MEDICAL HISTORY: Past Medical History:  Diagnosis Date   Actinic dermatitis    sees Dr. Rozann Lesches    Allergy    Endometriosis    Low back pain    Palpitations    Routine gynecological examination    sees Dr. Ronita Hipps    Seasonal allergies     PAST SURGICAL HISTORY: Past Surgical History:  Procedure Laterality Date   dysplasia     cervical ablation   KNEE ARTHROSCOPY     LAPAROSCOPY N/A 12/12/2013   Procedure: LAPAROSCOPY DIAGNOSTIC;  Surgeon: Margarette Asal, MD;  Location: [facility];  Service: Gynecology;  Laterality: N/A;   lumbar ESI     Dr Joya Salm    FAMILY HISTORY: Family History  Problem Relation Age of Onset   Hyperthyroidism Mother        Gwyndolyn Kaufman' disease Mother    Coronary artery disease Father    Cirrhosis Father    Multiple sclerosis Brother     SOCIAL HISTORY:  Social History   Socioeconomic History   Marital status: Married    Spouse name: Not on file   Number of children: 2   Years of education: BA   Highest education level: Not on file  Occupational History   Occupation: Works from home  Social Designer, fashion/clothing strain: Not on file   Food insecurity:    Worry: Not on file    Inability: Not on file   Transportation needs:    Medical: Not on file    Non-medical: Not on file  Tobacco Use   Smoking status: Former Smoker   Smokeless tobacco: Never Used  Substance and Sexual Activity   Alcohol use: Yes    Alcohol/week: 0.0 standard drinks    Comment: Has 1 drink per day   Drug use: No   Sexual activity: Not on file  Lifestyle   Physical activity:    Days per week: Not on file     Minutes per session: Not on file   Stress: Not on file  Relationships   Social connections:    Talks on phone: Not on file    Gets together:  Not on file    Attends religious service: Not on file    Active member of club or organization: Not on file    Attends meetings of clubs or organizations: Not on file    Relationship status: Not on file   Intimate partner violence:    Fear of current or ex partner: Not on file    Emotionally abused: Not on file    Physically abused: Not on file    Forced sexual activity: Not on file  Other Topics Concern   Not on file  Social History Narrative   Lives with husband   Caffeine use: 1-2 cups per day   Left handed      PHYSICAL EXAM  There were no vitals filed for this visit.  There is no height or weight on file to calculate BMI.   General: The patient is well-developed and well-nourished and in no acute distress   Neurologic Exam  Mental status: The patient is alert and oriented x 3 at the time of the examination. The patient has apparent normal recent and remote memory, with an apparently normal attention span and concentration ability.   Speech is normal.  Cranial nerves: Eye movements are normal.  Facial strength is normal..  The tongue is midline, and the patient has symmetric elevation of the soft palate. No obvious hearing deficits are noted.  Motor:  Muscle bulk is normal.   Tone is normal. Strength is  5 / 5 in all 4 extremities.   Sensory: She gets an electric-like sensation down her spine when she flexes her neck.  She has reduced sensation to touch and temperature in the hands relative to the shoulders.  She also has reduced sensation to touch and temperature in the right leg relative to the left leg.  Vibration sensation was more symmetric.  Coordination: Cerebellar testing reveals good finger-nose-finger and heel-to-shin bilaterally.  Gait and station: Station is normal.  The gait is normal.  Tandem gait is normal.   Romberg is negative.  Reflexes: Deep tendon reflexes are symmetric and 3 bilaterally.       DIAGNOSTIC DATA (LABS, IMAGING, TESTING) - I reviewed patient records, labs, notes, testing and imaging myself where available.  Lab Results  Component Value Date   WBC 5.0 08/15/2018   HGB 13.8 08/15/2018   HCT 41.3 08/15/2018   MCV 90 08/15/2018   PLT 171 08/15/2018      Component Value Date/Time   NA 139 06/18/2018 1117   K 4.3 06/18/2018 1117   CL 102 06/18/2018 1117   CO2 22 06/18/2018 1117   GLUCOSE 80 06/18/2018 1117   GLUCOSE 89 03/11/2018 0934   BUN 11 06/18/2018 1117   CREATININE 0.77 06/18/2018 1117   CALCIUM 8.8 06/18/2018 1117   PROT 6.7 08/15/2018 0914   ALBUMIN 4.6 08/15/2018 0914   AST 37 08/15/2018 0914   ALT 42 (H) 08/15/2018 0914   ALKPHOS 34 (L) 08/15/2018 0914   BILITOT 0.3 08/15/2018 0914   GFRNONAA 93 06/18/2018 1117   GFRAA 107 06/18/2018 1117   Lab Results  Component Value Date   CHOL 167 03/11/2018   HDL 52.00 03/11/2018   LDLCALC 102 (H) 03/11/2018   TRIG 62.0 03/11/2018   CHOLHDL 3 03/11/2018   No results found for: HGBA1C Lab Results  Component Value Date   VITAMINB12 336 05/02/2018   Lab Results  Component Value Date   TSH 1.21 03/11/2018       ASSESSMENT AND PLAN  Lhermitte's  sign positive  Multiple sclerosis (HCC)  Weakness of right foot  Vitamin D deficiency    Ladawn Boullion A. Felecia Shelling, MD, [facility] 1/56/6483, 0:32 PM Certified in Neurology, Clinical Neurophysiology, Sleep Medicine, Pain Medicine and Neuroimaging  [facility] [address], [place], [place] [address] [phone]

## 2018-10-16 ENCOUNTER — Telehealth: Payer: Self-pay | Admitting: Neurology

## 2018-10-16 NOTE — Telephone Encounter (Signed)
Patient returned my call she is scheduled for 01/15/19 at [facility].

## 2018-10-16 NOTE — Telephone Encounter (Signed)
Patient has Medical sales representative.. I left a voicemail for patient to call back about scheduling her MRI in 4 months.

## 2018-10-22 ENCOUNTER — Other Ambulatory Visit: Payer: Self-pay

## 2018-10-22 ENCOUNTER — Other Ambulatory Visit (INDEPENDENT_AMBULATORY_CARE_PROVIDER_SITE_OTHER): Payer: Self-pay

## 2018-10-22 DIAGNOSIS — Z0289 Encounter for other administrative examinations: Secondary | ICD-10-CM

## 2018-10-22 DIAGNOSIS — G35 Multiple sclerosis: Secondary | ICD-10-CM

## 2018-10-23 LAB — CBC WITH DIFFERENTIAL/PLATELET
Basophils Absolute: 0 10*3/uL (ref 0.0–0.2)
Basos: 1 %
EOS (ABSOLUTE): 0.1 10*3/uL (ref 0.0–0.4)
Eos: 2 %
Hematocrit: 39.7 % (ref 34.0–46.6)
Hemoglobin: 14.1 g/dL (ref 11.1–15.9)
Immature Grans (Abs): 0 10*3/uL (ref 0.0–0.1)
Immature Granulocytes: 0 %
Lymphocytes Absolute: 1.4 10*3/uL (ref 0.7–3.1)
Lymphs: 31 %
MCH: 32.1 pg (ref 26.6–33.0)
MCHC: 35.5 g/dL (ref 31.5–35.7)
MCV: 90 fL (ref 79–97)
Monocytes Absolute: 0.3 10*3/uL (ref 0.1–0.9)
Monocytes: 8 %
Neutrophils Absolute: 2.6 10*3/uL (ref 1.4–7.0)
Neutrophils: 58 %
Platelets: 148 10*3/uL — ABNORMAL LOW (ref 150–450)
RBC: 4.39 x10E6/uL (ref 3.77–5.28)
RDW: 12.9 % (ref 11.7–15.4)
WBC: 4.4 10*3/uL (ref 3.4–10.8)

## 2018-10-23 LAB — HEPATIC FUNCTION PANEL
ALT: 21 IU/L (ref 0–32)
AST: 18 IU/L (ref 0–40)
Albumin: 4.3 g/dL (ref 3.8–4.8)
Alkaline Phosphatase: 33 IU/L — ABNORMAL LOW (ref 39–117)
Bilirubin Total: 0.3 mg/dL (ref 0.0–1.2)
Bilirubin, Direct: 0.1 mg/dL (ref 0.00–0.40)
Total Protein: 6.3 g/dL (ref 6.0–8.5)

## 2018-10-24 ENCOUNTER — Telehealth: Payer: Self-pay | Admitting: *Deleted

## 2018-10-24 NOTE — Telephone Encounter (Signed)
-----   Message from Asa Lente, MD sent at 10/23/2018 12:42 PM EDT ----- Please let her know that the liver tests are back in the normal range.    The platelet counts were very slightly reduced but not enough to be concerned about.

## 2018-10-28 ENCOUNTER — Telehealth: Payer: Self-pay | Admitting: *Deleted

## 2018-10-28 NOTE — Telephone Encounter (Signed)
Request Reference Number: [id number]. VUMERITY CAP 231MG  is approved through 10/28/2023. For further questions, call [phone].

## 2018-10-28 NOTE — Telephone Encounter (Signed)
Submitted PA Vumerity on CMM. Key: [id number]. Waiting on determination.

## 2018-11-28 ENCOUNTER — Telehealth: Payer: Self-pay | Admitting: *Deleted

## 2018-11-28 NOTE — Telephone Encounter (Signed)
Sent mychart message

## 2018-12-16 NOTE — Telephone Encounter (Signed)
Pt called back and stated she will call [facility] directly to update her information with them.

## 2018-12-16 NOTE — Telephone Encounter (Signed)
Left vm for patient that [facility] has been trying to contact her for current active insurance coverage and update address and phone number. Pt was also sent mychart message. This is regarding medication Vumerity cap 231mg .

## 2018-12-18 LAB — HM MAMMOGRAPHY

## 2018-12-20 ENCOUNTER — Encounter: Payer: Self-pay | Admitting: Family Medicine

## 2018-12-31 NOTE — Telephone Encounter (Signed)
Novella Rob: [id number] (exp. 12/31/18 to 06/29/19)

## 2019-01-10 ENCOUNTER — Encounter: Payer: Self-pay | Admitting: Family Medicine

## 2019-01-10 MED ORDER — VITAMIN D3 50 MCG (2000 UT) PO TABS: 1.0000 | ORAL_TABLET | Freq: Every day | ORAL | 3 refills | Status: DC

## 2019-01-10 NOTE — Telephone Encounter (Signed)
I sent in for D3 tablets

## 2019-01-15 ENCOUNTER — Other Ambulatory Visit: Payer: Self-pay

## 2019-01-15 ENCOUNTER — Ambulatory Visit: Payer: Managed Care, Other (non HMO)

## 2019-01-15 DIAGNOSIS — G35 Multiple sclerosis: Secondary | ICD-10-CM

## 2019-01-15 MED ORDER — GADOBENATE DIMEGLUMINE 529 MG/ML IV SOLN
12.0000 mL | Freq: Once | INTRAVENOUS | Status: AC | PRN
  Administered 2019-01-15: 12 mL via INTRAVENOUS

## 2019-01-16 ENCOUNTER — Telehealth: Payer: Self-pay | Admitting: *Deleted

## 2019-01-16 NOTE — Telephone Encounter (Signed)
-----   Message from Britt Bottom, MD sent at 01/15/2019  6:45 PM EDT ----- Please let her know that the MRI of the brain did not show any new lesions.

## 2019-01-22 ENCOUNTER — Ambulatory Visit: Payer: Managed Care, Other (non HMO) | Admitting: Neurology

## 2019-01-22 ENCOUNTER — Encounter: Payer: Self-pay | Admitting: Neurology

## 2019-01-22 ENCOUNTER — Other Ambulatory Visit: Payer: Self-pay

## 2019-01-22 VITALS — BP 101/71 | HR 81 | Temp 98.4°F | Ht 62.0 in | Wt 115.5 lb

## 2019-01-22 DIAGNOSIS — Z79899 Other long term (current) drug therapy: Secondary | ICD-10-CM | POA: Diagnosis not present

## 2019-01-22 DIAGNOSIS — E559 Vitamin D deficiency, unspecified: Secondary | ICD-10-CM | POA: Diagnosis not present

## 2019-01-22 DIAGNOSIS — G35 Multiple sclerosis: Secondary | ICD-10-CM

## 2019-01-22 NOTE — Progress Notes (Signed)
[facility]  PATIENT: [name] DOB: 1971/04/01  REFERRING DOCTOR OR PCP:  Alysia Penna SOURCE: patient, notes from Dr. Sarajane Jews  _________________________________   HISTORICAL  CHIEF COMPLAINT:  Chief Complaint  Patient presents with  . Follow-up    RM 13,alone. Last seen 10/14/2018. Basal cell carcinoma being removed next week from forehead.  . Multiple Sclerosis    On Vumerity, vit D. Denies any new sx, doing well.    HISTORY OF PRESENT ILLNESS:  [name] 48 y.o. woman diagnosed with MS after her 06/10/2018 visit when she presented with numbness  Update 01/22/2019: She is on Vumerity and tolerates it well.   She has no exacerbation. Only mild flushing and no GI issues.   No new lesion on MRI.  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     Gait and balance is fine.    Bladder is ok most of the time though she has some urgency premenstrually.   She has no visual change.     She has a basal cell carcinoma and is getting Moh's surgery next week.   Update 10/14/2018 (Virtual): She has been on Vumerity since February 2020 and has tolerated it well.   She has occasioanl mild flushing.   She never had GI issues.   Sh denies any new neurologic symptoms and no exacerbations.  Some days she has no symptoms but she will sometimes have right foot numbness.   She denies any recent Lhermitte sign (none x 1 month).   Gait is doing well.  She denies any weakness.  She currently does not have any numbness.  Bladder function is good.  She denies visual changes.  She does not have much difficulties with fatigue.  Data:   MRI of the brain and cervical spine 06/12/18 showed multiple small T2/flair hyperintense foci in the periventricular and deep white matter.  There  was one focus in the left frontal lobe near the caudate nucleus that enhanced after contrast consistent with a subacute focus.   The MRI of the cervical spine showed a T2 hyperintense focus adjacent to C1-C2 posteriorly that likely cause her numbness.      Update 06/18/2018: After the last visit, she had an MRI of the brain that showed multiple small T2/flair hyperintense foci in the periventricular and deep white matter.  There was one focus in the left frontal lobe near the caudate nucleus that enhanced after contrast consistent with a subacute focus.   The MRI of the cervical spine showed a T2 hyperintense focus adjacent to C1-C2 posteriorly that likely cause her numbness.  The combination of the brain and cervical spine MRI confirmed the diagnosis of multiple sclerosis and she meets the McDonald criteria.  CSF analysis would not be needed.  She continues to note numbness in her hands but otherwise feels normal.  She has a Lhermitte sign frequently.  We had a detailed discussion about therapies (see plan below)   From 06/10/2018: She is a 48 year old woman who had the onset of numbness in both hands with a tingling sensation in mid November 2019.  The tingling is uncomfortable at times but not painful.    She also had numbness in her right foot and ankle.  The left is not noticeably numb but may  feel slightly different.    There was mild weakness in her right thigh --- had trouble doing lunges exercise.     Symptoms built up over a day or two and have been mosty constant since then but is maybe better over the last week or two.   She has a Lhermitte sign which was worse in November than now.  She reports an electric-like buzzing sensation when she flexes her neck.  She has also noted more fatigue.    She had no bladder or bowel issues.     She denied any gait issues.    She denies any new visual problems though she feels she needs new glasses as vision mildly more blurry bilaterally.     Fatigue is  still present but better than last month.    She sleeps ok but some nights does not get enough sleep.    Mood is doing well.   She denies any cognitive issues.    She saw rheumatology and labwork was fine.  Vitamin B12, TSH, ESR were normal.  She had a wrist nerve sonogram and was told that the one nerve was slightly enlarged.  She is otherwise in good health.   She has seasonal allergies.   She has no autoimmune disorders.   Her brother has active secondary progressive MS, diagnosed with MS 4-5 years ago but symptomatic for many more years.Marland Kitchen  REVIEW OF SYSTEMS: Constitutional: No fevers, chills, sweats, or change in appetite Eyes: No visual changes, double vision, eye pain Ear, nose and throat: No hearing loss, ear pain, nasal congestion, sore throat Cardiovascular: No chest pain, palpitations Respiratory: No shortness of breath at rest or with exertion.   No wheezes GastrointestinaI: No nausea, vomiting, diarrhea, abdominal pain, fecal incontinence Genitourinary: No dysuria, urinary retention or frequency.  No nocturia. Musculoskeletal: No neck pain, back pain Integumentary: No rash, pruritus, skin lesions Neurological: as above Psychiatric: No depression at this time.  No anxiety Endocrine: No palpitations, diaphoresis, change in appetite, change in weigh or increased thirst Hematologic/Lymphatic: No anemia, purpura, petechiae. Allergic/Immunologic: No itchy/runny eyes, nasal congestion, recent allergic reactions, rashes  ALLERGIES: Allergies  Allergen Reactions  . Amoxicillin Diarrhea  . Clarithromycin Rash  . Erythromycin Base Rash  . Moxifloxacin Swelling and Anxiety    lips swell    HOME MEDICATIONS:  Current Outpatient Medications:  .  Ascorbic Acid (VITAMIN C PO), Take by mouth., Disp: , Rfl:  .  B Complex Vitamins (VITAMIN-B COMPLEX) TABS, Take by mouth., Disp: , Rfl:  .  cetirizine (ZYRTEC) 10 MG tablet, Take 10 mg by mouth daily., Disp: , Rfl:  .  Cholecalciferol  (VITAMIN D3) 50 MCG (2000 UT) TABS, Take 1 tablet by mouth daily., Disp: 90 tablet, Rfl: 3 .  Diroximel Fumarate (VUMERITY) 231 MG CPDR, Take 2 capsules by mouth 2 (two) times daily., Disp: 120 capsule, Rfl: 11 .  ibuprofen (ADVIL) 200 MG tablet, Take 4 tablets (800 mg total) by mouth every 8 (eight) hours as needed. (Patient taking differently: Take 200 mg by mouth every 8 (eight) hours as needed. ), Disp: 30 tablet, Rfl: 0 .  lidocaine (LIDODERM) 5 %, Place 1 patch onto the skin daily. Remove & Discard patch within 12 hours or as directed by MD, Disp: 30 patch, Rfl: 5 .  Multiple Vitamins-Minerals (MULTIVITAMIN ADULT PO), Take by mouth., Disp: , Rfl:  .  Omega-3 Fatty Acids (OMEGA 3 PO), Take by mouth., Disp: , Rfl:  .  omeprazole (PRILOSEC) 20 MG  capsule, Take 20 mg by mouth daily., Disp: , Rfl:   PAST MEDICAL HISTORY: Past Medical History:  Diagnosis Date  . Actinic dermatitis    sees Dr. Rozann Lesches   . Allergy   . Endometriosis   . Low back pain   . Palpitations   . Routine gynecological examination    sees Dr. Ronita Hipps   . Seasonal allergies     PAST SURGICAL HISTORY: Past Surgical History:  Procedure Laterality Date  . dysplasia     cervical ablation  . KNEE ARTHROSCOPY    . LAPAROSCOPY N/A 12/12/2013   Procedure: LAPAROSCOPY DIAGNOSTIC;  Surgeon: Margarette Asal, MD;  Location: [facility];  Service: Gynecology;  Laterality: N/A;  . lumbar ESI     Dr Joya Salm    FAMILY HISTORY: Family History  Problem Relation Age of Onset  . Hyperthyroidism Mother        Graves   . Graves' disease Mother   . Coronary artery disease Father   . Cirrhosis Father   . Multiple sclerosis Brother     SOCIAL HISTORY:  Social History   Socioeconomic History  . Marital status: Married    Spouse name: Not on file  . Number of children: 2  . Years of education: BA  . Highest education level: Not on file  Occupational History  . Occupation: Works from home  Social Needs  . Financial  resource strain: Not on file  . Food insecurity    Worry: Not on file    Inability: Not on file  . Transportation needs    Medical: Not on file    Non-medical: Not on file  Tobacco Use  . Smoking status: Former Research scientist (life sciences)  . Smokeless tobacco: Never Used  Substance and Sexual Activity  . Alcohol use: Yes    Alcohol/week: 0.0 standard drinks    Comment: Has 1 drink per day  . Drug use: No  . Sexual activity: Not on file  Lifestyle  . Physical activity    Days per week: Not on file    Minutes per session: Not on file  . Stress: Not on file  Relationships  . Social Herbalist on phone: Not on file    Gets together: Not on file    Attends religious service: Not on file    Active member of club or organization: Not on file    Attends meetings of clubs or organizations: Not on file    Relationship status: Not on file  . Intimate partner violence    Fear of current or ex partner: Not on file    Emotionally abused: Not on file    Physically abused: Not on file    Forced sexual activity: Not on file  Other Topics Concern  . Not on file  Social History Narrative   Lives with husband   Caffeine use: 1-2 cups per day   Left handed      PHYSICAL EXAM  Vitals:   01/22/19 0957  BP: 101/71  Pulse: 81  Temp: 98.4 F (36.9 C)  Weight: 115 lb 8 oz (52.4 kg)  Height: '5\' 2"'  (1.575 m)    Body mass index is 21.13 kg/m.   General: The patient is well-developed and well-nourished and in no acute distress.   Bandaid over basal cell forehead biopsy.    Neurologic Exam  Mental status: The patient is alert and oriented x 3 at the time of the examination. The patient has apparent normal recent  and remote memory, with an apparently normal attention span and concentration ability.   Speech is normal.  Cranial nerves: Eye movements are normal.  Facial strength is normal..   No obvious hearing deficits are noted.  Motor:  Muscle bulk is normal.   Tone is normal. Strength is  5  / 5 in all 4 extremities.   Sensory: She gets an electric-like sensation down her spine when she flexes her neck.  She has reduced sensation to touch and temperature in the hands relative to the shoulders.  She also has reduced sensation to touch and temperature in the right leg relative to the left leg.  Vibration sensation was more symmetric.  Coordination: Cerebellar testing shows good finger-nose-finger and heel-to-shin bilaterally.  Gait and station: Station is normal.  Gait and tandem gait are normal.  Romberg is negative.  Reflexes: Deep tendon reflexes are symmetric and 3 bilaterally.       DIAGNOSTIC DATA (LABS, IMAGING, TESTING) - I reviewed patient records, labs, notes, testing and imaging myself where available.  Lab Results  Component Value Date   WBC 4.4 10/22/2018   HGB 14.1 10/22/2018   HCT 39.7 10/22/2018   MCV 90 10/22/2018   PLT 148 (L) 10/22/2018      Component Value Date/Time   NA 139 06/18/2018 1117   K 4.3 06/18/2018 1117   CL 102 06/18/2018 1117   CO2 22 06/18/2018 1117   GLUCOSE 80 06/18/2018 1117   GLUCOSE 89 03/11/2018 0934   BUN 11 06/18/2018 1117   CREATININE 0.77 06/18/2018 1117   CALCIUM 8.8 06/18/2018 1117   PROT 6.3 10/22/2018 0941   ALBUMIN 4.3 10/22/2018 0941   AST 18 10/22/2018 0941   ALT 21 10/22/2018 0941   ALKPHOS 33 (L) 10/22/2018 0941   BILITOT 0.3 10/22/2018 0941   GFRNONAA 93 06/18/2018 1117   GFRAA 107 06/18/2018 1117   Lab Results  Component Value Date   CHOL 167 03/11/2018   HDL 52.00 03/11/2018   LDLCALC 102 (H) 03/11/2018   TRIG 62.0 03/11/2018   CHOLHDL 3 03/11/2018   No results found for: HGBA1C Lab Results  Component Value Date   VITAMINB12 336 05/02/2018   Lab Results  Component Value Date   TSH 1.21 03/11/2018       ASSESSMENT AND PLAN    1. Multiple sclerosis ([place])   2. Vitamin D deficiency   3. High risk medication use     1.   Continue Vumerity.   Check labs (CBCD, LFT) 2.   Vi D was low  in past, we will check 3.   Stay active and exercise as possible 4.   rtc 6 months or sooner if new or worsening neurologic issues.   Marlyss Cissell A. Felecia Shelling, MD, PhD, FAAN Certified in Neurology, Clinical Neurophysiology, Sleep Medicine, Pain Medicine and Neuroimaging Director, [place] [facility] [address], [place], [place] [address] [phone]

## 2019-01-23 ENCOUNTER — Telehealth: Payer: Self-pay | Admitting: *Deleted

## 2019-01-23 LAB — CBC WITH DIFFERENTIAL/PLATELET
Basophils Absolute: 0.1 10*3/uL (ref 0.0–0.2)
Basos: 1 %
EOS (ABSOLUTE): 0.1 10*3/uL (ref 0.0–0.4)
Eos: 1 %
Hematocrit: 42.3 % (ref 34.0–46.6)
Hemoglobin: 14.2 g/dL (ref 11.1–15.9)
Immature Grans (Abs): 0 10*3/uL (ref 0.0–0.1)
Immature Granulocytes: 0 %
Lymphocytes Absolute: 1.3 10*3/uL (ref 0.7–3.1)
Lymphs: 30 %
MCH: 31.3 pg (ref 26.6–33.0)
MCHC: 33.6 g/dL (ref 31.5–35.7)
MCV: 93 fL (ref 79–97)
Monocytes Absolute: 0.4 10*3/uL (ref 0.1–0.9)
Monocytes: 8 %
Neutrophils Absolute: 2.5 10*3/uL (ref 1.4–7.0)
Neutrophils: 60 %
Platelets: 149 10*3/uL — ABNORMAL LOW (ref 150–450)
RBC: 4.53 x10E6/uL (ref 3.77–5.28)
RDW: 12.5 % (ref 11.7–15.4)
WBC: 4.2 10*3/uL (ref 3.4–10.8)

## 2019-01-23 LAB — HEPATIC FUNCTION PANEL
ALT: 19 IU/L (ref 0–32)
AST: 19 IU/L (ref 0–40)
Albumin: 4.7 g/dL (ref 3.8–4.8)
Alkaline Phosphatase: 32 IU/L — ABNORMAL LOW (ref 39–117)
Bilirubin Total: 0.4 mg/dL (ref 0.0–1.2)
Bilirubin, Direct: 0.1 mg/dL (ref 0.00–0.40)
Total Protein: 6.5 g/dL (ref 6.0–8.5)

## 2019-01-23 LAB — VITAMIN D 25 HYDROXY (VIT D DEFICIENCY, FRACTURES): Vit D, 25-Hydroxy: 62.2 ng/mL (ref 30.0–100.0)

## 2019-01-23 NOTE — Telephone Encounter (Signed)
-----   Message from Britt Bottom, MD sent at 01/23/2019  9:48 AM EDT ----- Please let the patient know that the lab work is fine.   Vit D was also normal

## 2019-03-04 ENCOUNTER — Encounter: Payer: Self-pay | Admitting: Family Medicine

## 2019-03-07 MED ORDER — VITAMIN D3 1.25 MG (50000 UT) PO CAPS: 1.0000 | ORAL_CAPSULE | ORAL | 3 refills | Status: DC

## 2019-03-07 NOTE — Telephone Encounter (Signed)
I changed the D3 to 50,000 unit capsules once a week

## 2019-03-21 NOTE — Telephone Encounter (Deleted)
Please advise?  Pt aware that you will be back Monday

## 2019-05-12 ENCOUNTER — Telehealth: Payer: Self-pay | Admitting: Neurology

## 2019-05-12 NOTE — Telephone Encounter (Signed)
1) Medication(s) Requested (by name): Diroximel Fumarate (VUMERITY) 231 MG CPDR   2) [facility]: [facility] 3) Special Requests:   Patient called stating she was advised a PA would be needed for the medication for insurance coverage. Patient stated the insurance would be [employer]. Please follow up.

## 2019-05-12 NOTE — Telephone Encounter (Signed)
Called, LVM for pt to call. Advised I reviewed chart and see where she has [place]. Wanting to verify her insurance is changing to [facility]. If so, we would need new insurance info and pharmacy benefit info in order to complete PA via [facility]

## 2019-05-12 NOTE — Telephone Encounter (Signed)
Pt sent mychart message with updated insurance info. I will work on completing PA.

## 2019-05-14 ENCOUNTER — Telehealth: Payer: Self-pay

## 2019-05-14 NOTE — Telephone Encounter (Signed)
Message from Plan Request Reference Number: [id number]. VUMERITY CAP [id number]  is approved through 05/13/2020. For further questions, call [phone].

## 2019-05-14 NOTE — Telephone Encounter (Signed)
PA done for Vumerity on cover my meds and pending.[facility] is reviewing your PA request. Typically an electronic response will be received within 72 hours. To check for an update later, open this request from your dashboard. You may close this dialog and return to your dashboard to perform other tasks.

## 2019-06-17 ENCOUNTER — Other Ambulatory Visit: Payer: Self-pay | Admitting: *Deleted

## 2019-06-17 MED ORDER — VUMERITY 231 MG PO CPDR: 2.0000 | DELAYED_RELEASE_CAPSULE | Freq: Two times a day (BID) | ORAL | 11 refills | Status: DC

## 2019-06-19 ENCOUNTER — Other Ambulatory Visit: Payer: Self-pay | Admitting: *Deleted

## 2019-06-19 MED ORDER — VUMERITY 231 MG PO CPDR: 2.0000 | DELAYED_RELEASE_CAPSULE | Freq: Two times a day (BID) | ORAL | 11 refills | Status: DC

## 2019-06-27 ENCOUNTER — Telehealth: Payer: Managed Care, Other (non HMO) | Admitting: Family Medicine

## 2019-06-30 ENCOUNTER — Ambulatory Visit: Payer: Managed Care, Other (non HMO) | Admitting: Family Medicine

## 2019-06-30 ENCOUNTER — Encounter: Payer: Self-pay | Admitting: Family Medicine

## 2019-06-30 ENCOUNTER — Other Ambulatory Visit: Payer: Self-pay

## 2019-06-30 VITALS — BP 120/64 | HR 98 | Temp 98.1°F | Wt 114.0 lb

## 2019-06-30 DIAGNOSIS — K1120 Sialoadenitis, unspecified: Secondary | ICD-10-CM

## 2019-06-30 MED ORDER — AMOXICILLIN-POT CLAVULANATE 875-125 MG PO TABS: 1.0000 | ORAL_TABLET | Freq: Two times a day (BID) | ORAL | 0 refills | Status: DC

## 2019-06-30 MED ORDER — FLUCONAZOLE 150 MG PO TABS: 150.0000 mg | ORAL_TABLET | Freq: Once | ORAL | 2 refills | Status: AC

## 2019-06-30 MED ORDER — TRAMADOL HCL 50 MG PO TABS: 100.0000 mg | ORAL_TABLET | Freq: Four times a day (QID) | ORAL | 0 refills | Status: DC | PRN

## 2019-06-30 NOTE — Progress Notes (Signed)
   Subjective:    Patient ID: [name], female    DOB: April 25, 1971, 49 y.o.   MRN: [id number]  HPI Here for 5 days of pain and swelling under the left ear and left jaw. No fever. No ST or cough. Ibuprofen does not help. She saw a [facility] on 06-27-19 and they diagnosed her with an ear infection. They prescribed Doxycycline, but she has not taken this yet. No pain with chewing.    Review of Systems  Constitutional: Negative.   HENT: Positive for ear pain and facial swelling. Negative for congestion, dental problem, ear discharge, postnasal drip, rhinorrhea, sinus pressure, sinus pain, sore throat and trouble swallowing.   Eyes: Negative.   Respiratory: Negative.        Objective:   Physical Exam Constitutional:      Appearance: Normal appearance.  HENT:     Head: Normocephalic and atraumatic.     Right Ear: Tympanic membrane, ear canal and external ear normal.     Left Ear: Tympanic membrane, ear canal and external ear normal.     Nose: Nose normal.     Mouth/Throat:     Pharynx: Oropharynx is clear.  Eyes:     Conjunctiva/sclera: Conjunctivae normal.  Neck:     Comments: There are several slightly swollen and quite tender salivary glands just inferior to the left mandibular angle   Cardiovascular:     Rate and Rhythm: Normal rate and regular rhythm.     Pulses: Normal pulses.     Heart sounds: Normal heart sounds.  Pulmonary:     Effort: Pulmonary effort is normal.     Breath sounds: Normal breath sounds.  Neurological:     Mental Status: She is alert.           Assessment & Plan:  Salivary gland adenitis, treat with Augmentin for 10 days. Use Tramadol for pain. Recheck prn. Gershon Crane, MD

## 2019-07-21 ENCOUNTER — Other Ambulatory Visit: Payer: Self-pay

## 2019-07-21 ENCOUNTER — Ambulatory Visit: Payer: Managed Care, Other (non HMO) | Admitting: Neurology

## 2019-07-21 ENCOUNTER — Encounter: Payer: Self-pay | Admitting: Neurology

## 2019-07-21 VITALS — BP 100/60 | HR 81 | Temp 97.3°F | Ht 62.0 in | Wt 115.5 lb

## 2019-07-21 DIAGNOSIS — Z79899 Other long term (current) drug therapy: Secondary | ICD-10-CM | POA: Diagnosis not present

## 2019-07-21 DIAGNOSIS — G35 Multiple sclerosis: Secondary | ICD-10-CM | POA: Diagnosis not present

## 2019-07-21 DIAGNOSIS — R200 Anesthesia of skin: Secondary | ICD-10-CM | POA: Diagnosis not present

## 2019-07-21 NOTE — Progress Notes (Signed)
[facility]  PATIENT: [name] DOB: [birth year]  REFERRING DOCTOR OR PCP:  Alysia Penna SOURCE: patient, notes from Dr. Sarajane Jews  _________________________________   HISTORICAL  CHIEF COMPLAINT:  Chief Complaint  Patient presents with  . Follow-up    RM 12, alone. Last seen 01/22/2019. Doing well well, no new sx.   . Multiple Sclerosis    On Vumerity    HISTORY OF PRESENT ILLNESS:  [name] 49 y.o. woman diagnosed with MS after her 06/10/2018 visit when she presented with numbness  Update 07/21/2019: She feels she is mostly stable with no exacerbation.    She is on Vumerity and tolerates  She is walking well.   She has no problems with balance and goes up and down stairs well.   She has no weakness but notes numbness in her right foot that fluctuates.   Bladder function is fine.   Vision is doing well.    She has mild fatigue but it is not preventing her from doing what she needs to do.  She denies depression.   She has some insomnia, both sleep onset and maintenance.   She notes mild cognitive issues with reduced focus and attention.      Update 01/22/2019: She is on Vumerity and tolerates it well.   She has no exacerbation. Only mild flushing and no GI issues.   No new lesion on MRI.  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     Gait and balance is fine.    Bladder is ok most of the time though she has some urgency premenstrually.   She has no visual change.     She has a basal cell carcinoma and is getting Moh's surgery next week.   Update 10/14/2018 (Virtual): She has been on Vumerity since February 2020 and has tolerated it well.   She has occasioanl mild flushing.   She never had GI issues.   Sh denies any new neurologic symptoms and no  exacerbations.  Some days she has no symptoms but she will sometimes have right foot numbness.   She denies any recent Lhermitte sign (none x 1 month).   Gait is doing well.  She denies any weakness.  She currently does not have any numbness.  Bladder function is good.  She denies visual changes.  She does not have much difficulties with fatigue.  Data:   MRI of the brain and cervical spine 06/12/18 showed multiple small T2/flair hyperintense foci in the periventricular and deep white matter.  There was one focus in the left frontal lobe near the caudate nucleus that enhanced after contrast consistent with a subacute focus.   The MRI of the cervical spine showed a T2 hyperintense focus adjacent to C1-C2 posteriorly that likely cause her numbness.      Update 06/18/2018: After the last visit, she had an MRI of the brain that showed multiple small T2/flair hyperintense foci in the periventricular and deep white matter.  There was one focus in the left frontal lobe near the caudate nucleus that enhanced after contrast consistent with a subacute focus.   The MRI of the cervical spine showed a T2 hyperintense focus adjacent to C1-C2 posteriorly that likely cause her numbness.  The combination of the brain and cervical  spine MRI confirmed the diagnosis of multiple sclerosis and she meets the McDonald criteria.  CSF analysis would not be needed.  She continues to note numbness in her hands but otherwise feels normal.  She has a Lhermitte sign frequently.  We had a detailed discussion about therapies (see plan below)   From 06/10/2018: She is a 49 year old woman who had the onset of numbness in both hands with a tingling sensation in mid November 2019.  The tingling is uncomfortable at times but not painful.    She also had numbness in her right foot and ankle.  The left is not noticeably numb but may feel slightly different.    There was mild weakness in her right thigh --- had trouble doing lunges exercise.      Symptoms built up over a day or two and have been mosty constant since then but is maybe better over the last week or two.   She has a Lhermitte sign which was worse in November than now.  She reports an electric-like buzzing sensation when she flexes her neck.  She has also noted more fatigue.    She had no bladder or bowel issues.     She denied any gait issues.    She denies any new visual problems though she feels she needs new glasses as vision mildly more blurry bilaterally.     Fatigue is still present but better than last month.    She sleeps ok but some nights does not get enough sleep.    Mood is doing well.   She denies any cognitive issues.    She saw rheumatology and labwork was fine.  Vitamin B12, TSH, ESR were normal.  She had a wrist nerve sonogram and was told that the one nerve was slightly enlarged.  She is otherwise in good health.   She has seasonal allergies.   She has no autoimmune disorders.   Her brother has active secondary progressive MS, diagnosed with MS 4-5 years ago but symptomatic for many more years.Marland Kitchen  REVIEW OF SYSTEMS: Constitutional: No fevers, chills, sweats, or change in appetite Eyes: No visual changes, double vision, eye pain Ear, nose and throat: No hearing loss, ear pain, nasal congestion, sore throat Cardiovascular: No chest pain, palpitations Respiratory: No shortness of breath at rest or with exertion.   No wheezes GastrointestinaI: No nausea, vomiting, diarrhea, abdominal pain, fecal incontinence Genitourinary: No dysuria, urinary retention or frequency.  No nocturia. Musculoskeletal: No neck pain, back pain Integumentary: No rash, pruritus, skin lesions Neurological: as above Psychiatric: No depression at this time.  No anxiety Endocrine: No palpitations, diaphoresis, change in appetite, change in weigh or increased thirst Hematologic/Lymphatic: No anemia, purpura, petechiae. Allergic/Immunologic: No itchy/runny eyes, nasal congestion, recent  allergic reactions, rashes  ALLERGIES: Allergies  Allergen Reactions  . Clarithromycin Rash  . Erythromycin Base Rash  . Moxifloxacin Swelling and Anxiety    lips swell    HOME MEDICATIONS:  Current Outpatient Medications:  .  Acetylcysteine (NAC PO), Take 1,000 mg by mouth daily., Disp: , Rfl:  .  Ascorbic Acid (VITAMIN C PO), Take by mouth., Disp: , Rfl:  .  B Complex Vitamins (VITAMIN-B COMPLEX) TABS, Take by mouth., Disp: , Rfl:  .  cetirizine (ZYRTEC) 10 MG tablet, Take 10 mg by mouth daily., Disp: , Rfl:  .  Cholecalciferol (VITAMIN D3) 1.25 MG (50000 UT) CAPS, Take 1 capsule by mouth once a week., Disp: 12 capsule, Rfl: 3 .  ibuprofen (ADVIL) 200  MG tablet, Take 4 tablets (800 mg total) by mouth every 8 (eight) hours as needed. (Patient taking differently: Take 200 mg by mouth every 8 (eight) hours as needed. ), Disp: 30 tablet, Rfl: 0 .  lidocaine (LIDODERM) 5 %, Place 1 patch onto the skin daily. Remove & Discard patch within 12 hours or as directed by MD, Disp: 30 patch, Rfl: 5 .  Multiple Vitamins-Minerals (MULTIVITAMIN ADULT PO), Take by mouth., Disp: , Rfl:  .  Omega-3 Fatty Acids (OMEGA 3 PO), Take by mouth., Disp: , Rfl:  .  omeprazole (PRILOSEC) 20 MG capsule, Take 20 mg by mouth daily., Disp: , Rfl:  .  traMADol (ULTRAM) 50 MG tablet, Take 2 tablets (100 mg total) by mouth every 6 (six) hours as needed for moderate pain., Disp: 60 tablet, Rfl: 0 .  VUMERITY 231 MG CPDR, Take 2 capsules by mouth 2 (two) times daily., Disp: 120 capsule, Rfl: 11  PAST MEDICAL HISTORY: Past Medical History:  Diagnosis Date  . Actinic dermatitis    sees Dr. Rozann Lesches   . Allergy   . Endometriosis   . Low back pain   . Palpitations   . Routine gynecological examination    sees Dr. Ronita Hipps   . Seasonal allergies     PAST SURGICAL HISTORY: Past Surgical History:  Procedure Laterality Date  . dysplasia     cervical ablation  . KNEE ARTHROSCOPY    . LAPAROSCOPY N/A 12/12/2013    Procedure: LAPAROSCOPY DIAGNOSTIC;  Surgeon: Margarette Asal, MD;  Location: [facility];  Service: Gynecology;  Laterality: N/A;  . lumbar ESI     Dr Joya Salm    FAMILY HISTORY: Family History  Problem Relation Age of Onset  . Hyperthyroidism Mother        Graves   . Graves' disease Mother   . Coronary artery disease Father   . Cirrhosis Father   . Multiple sclerosis Brother     SOCIAL HISTORY:  Social History   Socioeconomic History  . Marital status: Married    Spouse name: Not on file  . Number of children: 2  . Years of education: BA  . Highest education level: Not on file  Occupational History  . Occupation: Works from home  Tobacco Use  . Smoking status: Former Research scientist (life sciences)  . Smokeless tobacco: Never Used  Substance and Sexual Activity  . Alcohol use: Yes    Alcohol/week: 0.0 standard drinks    Comment: Has 1 drink per day  . Drug use: No  . Sexual activity: Not on file  Other Topics Concern  . Not on file  Social History Narrative   Lives with husband   Caffeine use: 1-2 cups per day   Left handed    Social Determinants of Health   Financial Resource Strain:   . Difficulty of Paying Living Expenses: Not on file  Food Insecurity:   . Worried About Charity fundraiser in the Last Year: Not on file  . Ran Out of Food in the Last Year: Not on file  Transportation Needs:   . Lack of Transportation (Medical): Not on file  . Lack of Transportation (Non-Medical): Not on file  Physical Activity:   . Days of Exercise per Week: Not on file  . Minutes of Exercise per Session: Not on file  Stress:   . Feeling of Stress : Not on file  Social Connections:   . Frequency of Communication with Friends and Family: Not on file  .  Frequency of Social Gatherings with Friends and Family: Not on file  . Attends Religious Services: Not on file  . Active Member of Clubs or Organizations: Not on file  . Attends Archivist Meetings: Not on file  . Marital Status: Not on  file  Intimate Partner Violence:   . Fear of Current or Ex-Partner: Not on file  . Emotionally Abused: Not on file  . Physically Abused: Not on file  . Sexually Abused: Not on file     PHYSICAL EXAM  Vitals:   07/21/19 0957  BP: 100/60  Pulse: 81  Temp: (!) 97.3 F (36.3 C)  SpO2: 98%  Weight: 115 lb 8 oz (52.4 kg)  Height: '5\' 2"'  (1.575 m)    Body mass index is 21.13 kg/m.   General: The patient is well-developed and well-nourished and in no acute distress.   Bandaid over basal cell forehead biopsy.    Neurologic Exam  Mental status: The patient is alert and oriented x 3 at the time of the examination. The patient has apparent normal recent and remote memory, with an apparently normal attention span and concentration ability.   Speech is normal.  Cranial nerves: Eye movements are normal.  Facial strength is normal..   No obvious hearing deficits are noted.  Motor:  Muscle bulk is normal.   Tone is normal. Strength is  5 / 5 in all 4 extremities.   Sensory: She no longer has a Lhermitte sign.  Vibration sensation and touch/temp is more symmetric.Marland Kitchen  Coordination: Cerebellar testing shows good finger-nose-finger and heel-to-shin bilaterally.  Gait and station: Station is normal.  Gait is normal.  Tandem gait is mildly wide.   Romberg is negative.  Reflexes: Deep tendon reflexes are symmetric and 3 bilaterally.       DIAGNOSTIC DATA (LABS, IMAGING, TESTING) - I reviewed patient records, labs, notes, testing and imaging myself where available.  Lab Results  Component Value Date   WBC 4.2 01/22/2019   HGB 14.2 01/22/2019   HCT 42.3 01/22/2019   MCV 93 01/22/2019   PLT 149 (L) 01/22/2019      Component Value Date/Time   NA 139 06/18/2018 1117   K 4.3 06/18/2018 1117   CL 102 06/18/2018 1117   CO2 22 06/18/2018 1117   GLUCOSE 80 06/18/2018 1117   GLUCOSE 89 03/11/2018 0934   BUN 11 06/18/2018 1117   CREATININE 0.77 06/18/2018 1117   CALCIUM 8.8 06/18/2018  1117   PROT 6.5 01/22/2019 1033   ALBUMIN 4.7 01/22/2019 1033   AST 19 01/22/2019 1033   ALT 19 01/22/2019 1033   ALKPHOS 32 (L) 01/22/2019 1033   BILITOT 0.4 01/22/2019 1033   GFRNONAA 93 06/18/2018 1117   GFRAA 107 06/18/2018 1117   Lab Results  Component Value Date   CHOL 167 03/11/2018   HDL 52.00 03/11/2018   LDLCALC 102 (H) 03/11/2018   TRIG 62.0 03/11/2018   CHOLHDL 3 03/11/2018   No results found for: HGBA1C Lab Results  Component Value Date   VITAMINB12 336 05/02/2018   Lab Results  Component Value Date   TSH 1.21 03/11/2018       ASSESSMENT AND PLAN    1. Multiple sclerosis ([place])   2. High risk medication use   3. Numbness     1.   Continue Vumerity.   Check labs (CBCD, LFT) 2.   She asked about low-dose naltrexone.  We discussed that there is not a lot of good evidence  that it helps MS but it does appear to be safe.  1 study showed that it may benefit fatigue.   3.   Stay active and exercise as possible 4.   rtc 6 months or sooner if new or worsening neurologic issues.   Demetrion Wesby A. Felecia Shelling, MD, PhD, FAAN Certified in Neurology, Clinical Neurophysiology, Sleep Medicine, Pain Medicine and Neuroimaging Director, [place] [facility] [address] [place], [place] [address] [phone]

## 2019-07-22 LAB — CBC WITH DIFFERENTIAL/PLATELET
Basophils Absolute: 0.1 10*3/uL (ref 0.0–0.2)
Basos: 2 %
EOS (ABSOLUTE): 0.1 10*3/uL (ref 0.0–0.4)
Eos: 2 %
Hematocrit: 42.2 % (ref 34.0–46.6)
Hemoglobin: 14.2 g/dL (ref 11.1–15.9)
Immature Grans (Abs): 0 10*3/uL (ref 0.0–0.1)
Immature Granulocytes: 0 %
Lymphocytes Absolute: 1.2 10*3/uL (ref 0.7–3.1)
Lymphs: 25 %
MCH: 31.7 pg (ref 26.6–33.0)
MCHC: 33.6 g/dL (ref 31.5–35.7)
MCV: 94 fL (ref 79–97)
Monocytes Absolute: 0.4 10*3/uL (ref 0.1–0.9)
Monocytes: 10 %
Neutrophils Absolute: 2.8 10*3/uL (ref 1.4–7.0)
Neutrophils: 61 %
Platelets: 167 10*3/uL (ref 150–450)
RBC: 4.48 x10E6/uL (ref 3.77–5.28)
RDW: 12.3 % (ref 11.7–15.4)
WBC: 4.6 10*3/uL (ref 3.4–10.8)

## 2019-07-22 LAB — HEPATIC FUNCTION PANEL
ALT: 14 IU/L (ref 0–32)
AST: 19 IU/L (ref 0–40)
Albumin: 4.2 g/dL (ref 3.8–4.8)
Alkaline Phosphatase: 36 IU/L — ABNORMAL LOW (ref 39–117)
Bilirubin Total: 0.3 mg/dL (ref 0.0–1.2)
Bilirubin, Direct: 0.09 mg/dL (ref 0.00–0.40)
Total Protein: 6.4 g/dL (ref 6.0–8.5)

## 2019-08-21 ENCOUNTER — Ambulatory Visit: Payer: Managed Care, Other (non HMO)

## 2019-10-15 ENCOUNTER — Other Ambulatory Visit: Payer: Self-pay | Admitting: *Deleted

## 2019-10-15 DIAGNOSIS — G35 Multiple sclerosis: Secondary | ICD-10-CM

## 2019-10-29 ENCOUNTER — Telehealth: Payer: Self-pay | Admitting: Neurology

## 2019-10-29 NOTE — Telephone Encounter (Signed)
Patient is scheduled at [facility] for 12/30/19. 

## 2019-11-25 ENCOUNTER — Other Ambulatory Visit: Payer: Self-pay | Admitting: Otolaryngology

## 2019-11-25 DIAGNOSIS — R221 Localized swelling, mass and lump, neck: Secondary | ICD-10-CM

## 2019-12-12 ENCOUNTER — Ambulatory Visit
Admission: RE | Admit: 2019-12-12 | Discharge: 2019-12-12 | Disposition: A | Discharge status: Home / Self Care | Payer: Managed Care, Other (non HMO) | Source: Ambulatory Visit | Attending: Otolaryngology | Admitting: Otolaryngology

## 2019-12-12 ENCOUNTER — Other Ambulatory Visit: Payer: Self-pay

## 2019-12-12 DIAGNOSIS — R221 Localized swelling, mass and lump, neck: Secondary | ICD-10-CM

## 2019-12-12 MED ORDER — IOPAMIDOL (ISOVUE-300) INJECTION 61%
75.0000 mL | Freq: Once | INTRAVENOUS | Status: AC | PRN
  Administered 2019-12-12: 75 mL via INTRAVENOUS

## 2019-12-22 NOTE — Telephone Encounter (Signed)
Rutherford Nail: [id number] (exp. 12/22/19 to 03/21/20)

## 2019-12-30 ENCOUNTER — Other Ambulatory Visit: Payer: Managed Care, Other (non HMO)

## 2020-01-13 ENCOUNTER — Ambulatory Visit: Payer: Managed Care, Other (non HMO)

## 2020-01-13 DIAGNOSIS — G35 Multiple sclerosis: Secondary | ICD-10-CM

## 2020-01-13 MED ORDER — GADOBENATE DIMEGLUMINE 529 MG/ML IV SOLN
10.0000 mL | Freq: Once | INTRAVENOUS | Status: AC | PRN
Start: 2020-01-13 — End: 2020-01-13
  Administered 2020-01-13: 10 mL via INTRAVENOUS

## 2020-01-19 ENCOUNTER — Ambulatory Visit: Payer: Managed Care, Other (non HMO) | Admitting: Neurology

## 2020-01-19 ENCOUNTER — Encounter: Payer: Self-pay | Admitting: Neurology

## 2020-01-19 VITALS — BP 116/76 | HR 71 | Ht 62.0 in | Wt 114.5 lb

## 2020-01-19 DIAGNOSIS — Z79899 Other long term (current) drug therapy: Secondary | ICD-10-CM

## 2020-01-19 DIAGNOSIS — R200 Anesthesia of skin: Secondary | ICD-10-CM | POA: Diagnosis not present

## 2020-01-19 DIAGNOSIS — G35 Multiple sclerosis: Secondary | ICD-10-CM | POA: Diagnosis not present

## 2020-01-19 NOTE — Progress Notes (Signed)
[facility]  PATIENT: [name] DOB: 1970/08/18  REFERRING DOCTOR OR PCP:  Gershon Crane SOURCE: patient, notes from Dr. Clent Ridges  _________________________________   HISTORICAL  CHIEF COMPLAINT:  Chief Complaint  Patient presents with  . Follow-up    RM 12, alone. Last seen 07/21/19.   . Multiple Sclerosis    On vumerity.     HISTORY OF PRESENT ILLNESS:  [name] 49 y.o. woman diagnosed with relapsing remitting MS after her 06/10/2018 visit when she presented with numbness  Update 01/19/2020: She is stable with no exacerbation.    She is on Vumerity and tolerates it well.   She has some fluctuations in numbness but otherwise notes no new symptoms.  MRI of the brain performed last week did not show any new MS lesions.  Of note, her initial exacerbation was most likely due to to the focus in the spinal cord adjacent to C2  She is walking well.   She has no problems with balance and goes up and down stairs well.   She has no weakness.  She has right foot tingling and numbness.  Most of the time it is mild.   Bladder function is the same -- some urge incontinence especially before her period over the last few years is stable.   Vision is doing well.    She notes mild fatigue but it is not preventing her from doing what she needs to do.  She denies depression.   Insomnia is better.   She notes mild cognitive issues with reduced focus and attention.    Not bad enough to treat.    She had the second Pfizer vaccination today.      MS history: She had the onset of numbness in both hands with a tingling sensation in mid November 2019.  She had a Lhermitte sign.  The tingling was uncomfortable at times but not painful.    She also had numbness in her right foot and ankle.  MRI of the cervical spine showed a nonenhancing focus adjacent to C1-C2 and MRI of the brain showed about a dozen T2 hyperintense foci consistent with MS.  Medications: She started Vumerity  February 2020:  Family history: Her brother has active secondary progressive MS, diagnosed with MS 4-5 years ago but symptomatic for many more years..  Imaging studies: MRI of the brain 01/13/2020 shows T2/FLAIR hyperintense foci predominantly in the periventricular white matter of the hemispheres.  The pattern and configuration is consistent with chronic demyelinating plaque associated with multiple sclerosis.  None of the foci enhances or appears to be acute.  They were all present and appear unchanged on the MRI from 01/15/2019.  MRI of the brain 01/15/2019 shows about a dozen T2/flair hyperintense foci in the hemispheres in the periventricular deep white matter.  These are consistent with chronic demyelinating plaque associated with multiple sclerosis.  MRI of the cervical spine 06/12/2018 shows a nonenhancing T2 hyperintense focus in the posterior spinal cord adjacent to C2. This is consistent with a demyelinating plaque associated with multiple sclerosis.   There are mild multilevel degenerative changes as detailed above.  This causes mild spinal stenosis at C4-C5 and C5-C6.  There is no nerve root compression  Pertinent labs: On 06/18/2018, hep B and hep C serology was negative.  QuantiFERON-TB was negative.  HIV was negative.  Varicella-zoster IgG is positive (2259).  Cytochrome P450 2C9 is *1/*3.    REVIEW OF SYSTEMS: Constitutional: No fevers, chills, sweats, or change in  appetite Eyes: No visual changes, double vision, eye pain Ear, nose and throat: No hearing loss, ear pain, nasal congestion, sore throat Cardiovascular: No chest pain, palpitations Respiratory: No shortness of breath at rest or with exertion.   No wheezes GastrointestinaI: No nausea, vomiting, diarrhea, abdominal pain, fecal incontinence Genitourinary: No dysuria, urinary retention or frequency.  No nocturia. Musculoskeletal: No neck pain, back pain Integumentary: No rash, pruritus, skin lesions Neurological: as  above Psychiatric: No depression at this time.  No anxiety Endocrine: No palpitations, diaphoresis, change in appetite, change in weigh or increased thirst Hematologic/Lymphatic: No anemia, purpura, petechiae. Allergic/Immunologic: No itchy/runny eyes, nasal congestion, recent allergic reactions, rashes  ALLERGIES: Allergies  Allergen Reactions  . Clarithromycin Rash  . Erythromycin Base Rash  . Moxifloxacin Swelling and Anxiety    lips swell    HOME MEDICATIONS:  Current Outpatient Medications:  .  Acetylcysteine (NAC PO), Take 1,000 mg by mouth daily., Disp: , Rfl:  .  Ascorbic Acid (VITAMIN C PO), Take by mouth., Disp: , Rfl:  .  B Complex Vitamins (VITAMIN-B COMPLEX) TABS, Take by mouth., Disp: , Rfl:  .  cetirizine (ZYRTEC) 10 MG tablet, Take 10 mg by mouth daily., Disp: , Rfl:  .  Cholecalciferol (VITAMIN D3) 1.25 MG (50000 UT) CAPS, Take 1 capsule by mouth once a week., Disp: 12 capsule, Rfl: 3 .  ibuprofen (ADVIL) 200 MG tablet, Take 4 tablets (800 mg total) by mouth every 8 (eight) hours as needed. (Patient taking differently: Take 200 mg by mouth every 8 (eight) hours as needed. ), Disp: 30 tablet, Rfl: 0 .  lidocaine (LIDODERM) 5 %, Place 1 patch onto the skin daily. Remove & Discard patch within 12 hours or as directed by MD, Disp: 30 patch, Rfl: 5 .  Multiple Vitamins-Minerals (MULTIVITAMIN ADULT PO), Take by mouth., Disp: , Rfl:  .  Omega-3 Fatty Acids (OMEGA 3 PO), Take by mouth., Disp: , Rfl:  .  omeprazole (PRILOSEC) 20 MG capsule, Take 20 mg by mouth daily., Disp: , Rfl:  .  traMADol (ULTRAM) 50 MG tablet, Take 2 tablets (100 mg total) by mouth every 6 (six) hours as needed for moderate pain., Disp: 60 tablet, Rfl: 0 .  VUMERITY 231 MG CPDR, Take 2 capsules by mouth 2 (two) times daily., Disp: 120 capsule, Rfl: 11  PAST MEDICAL HISTORY: Past Medical History:  Diagnosis Date  . Actinic dermatitis    sees Dr. Charlton Haws   . Allergy   . Endometriosis   . Low back  pain   . Palpitations   . Routine gynecological examination    sees Dr. Billy Coast   . Seasonal allergies     PAST SURGICAL HISTORY: Past Surgical History:  Procedure Laterality Date  . dysplasia     cervical ablation  . KNEE ARTHROSCOPY    . LAPAROSCOPY N/A 12/12/2013   Procedure: LAPAROSCOPY DIAGNOSTIC;  Surgeon: Meriel Pica, MD;  Location: [facility];  Service: Gynecology;  Laterality: N/A;  . lumbar ESI     Dr Jeral Fruit    FAMILY HISTORY: Family History  Problem Relation Age of Onset  . Hyperthyroidism Mother        Graves   . Graves' disease Mother   . Coronary artery disease Father   . Cirrhosis Father   . Multiple sclerosis Brother     SOCIAL HISTORY:  Social History   Socioeconomic History  . Marital status: Married    Spouse name: Not on file  . Number of children:  2  . Years of education: BA  . Highest education level: Not on file  Occupational History  . Occupation: Works from home  Tobacco Use  . Smoking status: Former Games developer  . Smokeless tobacco: Never Used  Substance and Sexual Activity  . Alcohol use: Yes    Alcohol/week: 0.0 standard drinks    Comment: Has 1 drink per day  . Drug use: No  . Sexual activity: Not on file  Other Topics Concern  . Not on file  Social History Narrative   Lives with husband   Caffeine use: 1-2 cups per day   Left handed    Social Determinants of Health   Financial Resource Strain:   . Difficulty of Paying Living Expenses: Not on file  Food Insecurity:   . Worried About Programme researcher, broadcasting/film/video in the Last Year: Not on file  . Ran Out of Food in the Last Year: Not on file  Transportation Needs:   . Lack of Transportation (Medical): Not on file  . Lack of Transportation (Non-Medical): Not on file  Physical Activity:   . Days of Exercise per Week: Not on file  . Minutes of Exercise per Session: Not on file  Stress:   . Feeling of Stress : Not on file  Social Connections:   . Frequency of Communication with Friends  and Family: Not on file  . Frequency of Social Gatherings with Friends and Family: Not on file  . Attends Religious Services: Not on file  . Active Member of Clubs or Organizations: Not on file  . Attends Banker Meetings: Not on file  . Marital Status: Not on file  Intimate Partner Violence:   . Fear of Current or Ex-Partner: Not on file  . Emotionally Abused: Not on file  . Physically Abused: Not on file  . Sexually Abused: Not on file     PHYSICAL EXAM  Vitals:   01/19/20 0956  BP: 116/76  Pulse: 71  Weight: 114 lb 8 oz (51.9 kg)  Height: 5\' 2"  (1.575 m)    Body mass index is 20.94 kg/m.   General: The patient is well-developed and well-nourished and in no acute distress.      Neurologic Exam  Mental status: The patient is alert and oriented x 3 at the time of the examination. The patient has apparent normal recent and remote memory, with an apparently normal attention span and concentration ability.   Speech is normal.  Cranial nerves: Eye movements are normal.  Facial strength is normal..   No obvious hearing deficits are noted.  Motor:  Muscle bulk is normal.   Tone is normal. Strength is  5 / 5 in all 4 extremities.   Sensory: She no longer has a Lhermitte sign.  She has symmetric sensation to touch, temperature and vibration.  Coordination: Cerebellar testing shows good finger-nose-finger and heel-to-shin bilaterally.  Gait and station: Station is normal.  Gait is normal.  Tandem gait is near normal.   Romberg is negative.  Reflexes: Deep tendon reflexes are symmetric and 3 bilaterally.       DIAGNOSTIC DATA (LABS, IMAGING, TESTING) - I reviewed patient records, labs, notes, testing and imaging myself where available.  Lab Results  Component Value Date   WBC 4.6 07/21/2019   HGB 14.2 07/21/2019   HCT 42.2 07/21/2019   MCV 94 07/21/2019   PLT 167 07/21/2019      Component Value Date/Time   NA 139 06/18/2018 1117  K 4.3 06/18/2018  1117   CL 102 06/18/2018 1117   CO2 22 06/18/2018 1117   GLUCOSE 80 06/18/2018 1117   GLUCOSE 89 03/11/2018 0934   BUN 11 06/18/2018 1117   CREATININE 0.77 06/18/2018 1117   CALCIUM 8.8 06/18/2018 1117   PROT 6.4 07/21/2019 1038   ALBUMIN 4.2 07/21/2019 1038   AST 19 07/21/2019 1038   ALT 14 07/21/2019 1038   ALKPHOS 36 (L) 07/21/2019 1038   BILITOT 0.3 07/21/2019 1038   GFRNONAA 93 06/18/2018 1117   GFRAA 107 06/18/2018 1117   Lab Results  Component Value Date   CHOL 167 03/11/2018   HDL 52.00 03/11/2018   LDLCALC 102 (H) 03/11/2018   TRIG 62.0 03/11/2018   CHOLHDL 3 03/11/2018   No results found for: HGBA1C Lab Results  Component Value Date   VITAMINB12 336 05/02/2018   Lab Results  Component Value Date   TSH 1.21 03/11/2018       ASSESSMENT AND PLAN    1. Multiple sclerosis (HCC)   2. High risk medication use   3. Numbness     1.   Continue Vumerity.   Check labs (CBCD, LFT) 2.   She has just completed the COVID-19 vaccination Proofreader) 3.   Stay active and exercise as possible 4.   rtc 6 months or sooner if new or worsening neurologic issues.   Kathey Simer A. Epimenio Foot, MD, PhD, FAAN Certified in Neurology, Clinical Neurophysiology, Sleep Medicine, Pain Medicine and Neuroimaging Director, [facility] at [facility]  [facility] [address] [place], [place] [address] [phone]

## 2020-01-20 LAB — CBC WITH DIFFERENTIAL/PLATELET
Basophils Absolute: 0 10*3/uL (ref 0.0–0.2)
Basos: 1 %
EOS (ABSOLUTE): 0.1 10*3/uL (ref 0.0–0.4)
Eos: 2 %
Hematocrit: 43.4 % (ref 34.0–46.6)
Hemoglobin: 14.6 g/dL (ref 11.1–15.9)
Immature Grans (Abs): 0 10*3/uL (ref 0.0–0.1)
Immature Granulocytes: 0 %
Lymphocytes Absolute: 1.3 10*3/uL (ref 0.7–3.1)
Lymphs: 26 %
MCH: 32.3 pg (ref 26.6–33.0)
MCHC: 33.6 g/dL (ref 31.5–35.7)
MCV: 96 fL (ref 79–97)
Monocytes Absolute: 0.4 10*3/uL (ref 0.1–0.9)
Monocytes: 8 %
Neutrophils Absolute: 3.3 10*3/uL (ref 1.4–7.0)
Neutrophils: 63 %
Platelets: 177 10*3/uL (ref 150–450)
RBC: 4.52 x10E6/uL (ref 3.77–5.28)
RDW: 11.8 % (ref 11.7–15.4)
WBC: 5.2 10*3/uL (ref 3.4–10.8)

## 2020-01-20 LAB — HEPATIC FUNCTION PANEL
ALT: 14 IU/L (ref 0–32)
AST: 17 IU/L (ref 0–40)
Albumin: 4.4 g/dL (ref 3.8–4.8)
Alkaline Phosphatase: 38 IU/L — ABNORMAL LOW (ref 48–121)
Bilirubin Total: 0.5 mg/dL (ref 0.0–1.2)
Bilirubin, Direct: 0.13 mg/dL (ref 0.00–0.40)
Total Protein: 6.9 g/dL (ref 6.0–8.5)

## 2020-01-28 ENCOUNTER — Other Ambulatory Visit: Payer: Self-pay | Admitting: Family Medicine

## 2020-02-16 MED ORDER — METHYLPREDNISOLONE 4 MG PO TABS: ORAL_TABLET | ORAL | 0 refills | Status: DC

## 2020-02-16 MED ORDER — CYCLOBENZAPRINE HCL 5 MG PO TABS
5.0000 mg | ORAL_TABLET | Freq: Three times a day (TID) | ORAL | 0 refills | Status: DC | PRN
Start: 2020-02-16 — End: 2020-07-28

## 2020-02-24 ENCOUNTER — Other Ambulatory Visit: Payer: Self-pay | Admitting: Family Medicine

## 2020-02-29 ENCOUNTER — Encounter: Payer: Self-pay | Admitting: Family Medicine

## 2020-03-01 MED ORDER — VITAMIN D3 1.25 MG (50000 UT) PO CAPS: ORAL_CAPSULE | ORAL | 2 refills | Status: DC

## 2020-05-13 ENCOUNTER — Telehealth: Payer: Self-pay | Admitting: *Deleted

## 2020-05-13 NOTE — Telephone Encounter (Signed)
Submitted PA Vumerity on CMM. Key: [id number]. Waiting on determination from [facility].

## 2020-05-14 NOTE — Telephone Encounter (Signed)
PA approved effective 05/13/2020 - 05/13/2021. PA# [employer] [phone]

## 2020-06-02 ENCOUNTER — Other Ambulatory Visit: Payer: Self-pay | Admitting: Neurology

## 2020-06-29 ENCOUNTER — Telehealth: Payer: Self-pay | Admitting: Neurology

## 2020-06-29 NOTE — Telephone Encounter (Signed)
Spoke with Dr. Epimenio Foot. He is ok to place ophthalmology referral for pt once she calls back with who she wants to be referred to

## 2020-06-29 NOTE — Telephone Encounter (Signed)
Called back. Since MS dx, she has been having issues with her eyes. Has seen optometrist in the past, but feels she needs to see ophthalmologist for further evaluation. She is having intermittent pain in eyes/dry eyes.  Denies any other new sx or concerns besides this. She will send mychart message to let us know who she would like to be referred to. She is going to speak with her mother first.

## 2020-06-29 NOTE — Telephone Encounter (Signed)
Pt called, Inquiring how to obtain a referral for a  Ophthalmologist. Because of MS patients having eye issues. Would like a call from the nurse.

## 2020-07-01 NOTE — Telephone Encounter (Signed)
Called pt to get update. She states she was able to make appt with ophthalmologist that her mother sees. No longer needs referral.

## 2020-07-28 ENCOUNTER — Ambulatory Visit: Payer: Managed Care, Other (non HMO) | Admitting: Neurology

## 2020-07-28 ENCOUNTER — Encounter: Payer: Self-pay | Admitting: Neurology

## 2020-07-28 VITALS — BP 105/46 | HR 82 | Ht 62.0 in | Wt 115.5 lb

## 2020-07-28 DIAGNOSIS — R29818 Other symptoms and signs involving the nervous system: Secondary | ICD-10-CM | POA: Diagnosis not present

## 2020-07-28 DIAGNOSIS — R200 Anesthesia of skin: Secondary | ICD-10-CM | POA: Diagnosis not present

## 2020-07-28 DIAGNOSIS — G35 Multiple sclerosis: Secondary | ICD-10-CM | POA: Diagnosis not present

## 2020-07-28 DIAGNOSIS — Z79899 Other long term (current) drug therapy: Secondary | ICD-10-CM | POA: Diagnosis not present

## 2020-07-28 DIAGNOSIS — E559 Vitamin D deficiency, unspecified: Secondary | ICD-10-CM

## 2020-07-28 NOTE — Progress Notes (Signed)
[facility]  PATIENT: [name] DOB: 1970/08/13  REFERRING DOCTOR OR PCP:  Gershon Crane SOURCE: patient, notes from Dr. Clent Ridges  _________________________________   HISTORICAL  CHIEF COMPLAINT:  Chief Complaint  Patient presents with  . Follow-up    RM 12, alone. Last seen 01/19/2020. On Vumerity for MS. Denies any new sx.    HISTORY OF PRESENT ILLNESS:  [name] 50 y.o. woman diagnosed with relapsing remitting MS after her 06/10/2018 visit when she presented with numbness  Update 07/28/2020: She has had dry eyes (positive test at ophth) and dry mouth over the last year.    She denies skin changes.  No major issues with swallow function.    She is going to start varencicline nasal spray.   SSA/SSB were negative though ANA was 1:320 with a nucleolar pattern.    Her MS is stable and she has no exacerbation.    She is on Vumerity and tolerates it well.   She has some fluctuations in numbness but otherwise notes no new symptoms. MRI of the brain performed 12/2019 showed no new MS lesions.  Of note, her initial exacerbation was most likely due to to the focus in the spinal cord adjacent to C2  She is walking well.  No falls.  She has no problems with balance and goes up and down stairs well.   She has no weakness.  She has right foot tingling and numbness - this fluctuates some has been worse lately.  Most of the time it is mild.   Lhermitte sign less common.   Bladder function is the same -- some urge incontinence especially before her period over the last few years is stable.   Vision is doing well.    She has some fatigue and 'hits the wall' early with activities.     She denies depression.   Insomnia is better.   She notes mild cognitive issues with reduced focus and attention.  These are stable.    She had Covid-19 (likely omicron Jan 2022 and had regular cold symptoms and felt foggy a few days.      MS history: She had the onset of numbness in both hands  with a tingling sensation in mid November 2019.  She had a Lhermitte sign.  The tingling was uncomfortable at times but not painful.    She also had numbness in her right foot and ankle.  MRI of the cervical spine showed a nonenhancing focus adjacent to C1-C2 and MRI of the brain showed about a dozen T2 hyperintense foci consistent with MS.  Medications: She started Vumerity February 2020:  Family history: Her brother has active secondary progressive MS, diagnosed with MS 4-5 years ago but symptomatic for many more years..  Imaging studies: MRI of the brain 01/13/2020 shows T2/FLAIR hyperintense foci predominantly in the periventricular white matter of the hemispheres.  The pattern and configuration is consistent with chronic demyelinating plaque associated with multiple sclerosis.  None of the foci enhances or appears to be acute.  They were all present and appear unchanged on the MRI from 01/15/2019.  MRI of the brain 01/15/2019 shows about a dozen T2/flair hyperintense foci in the hemispheres in the periventricular deep white matter.  These are consistent with chronic demyelinating plaque associated with multiple sclerosis.  MRI of the cervical spine 06/12/2018 shows a nonenhancing T2 hyperintense focus in the posterior spinal cord adjacent to C2. This is consistent with a demyelinating plaque associated with multiple sclerosis.  There are mild multilevel degenerative changes as detailed above.  This causes mild spinal stenosis at C4-C5 and C5-C6.  There is no nerve root compression  Pertinent labs: On 06/18/2018, hep B and hep C serology was negative.  QuantiFERON-TB was negative.  HIV was negative.  Varicella-zoster IgG is positive (2259).  Cytochrome P450 2C9 is *1/*3.    REVIEW OF SYSTEMS: Constitutional: No fevers, chills, sweats, or change in appetite Eyes: No visual changes, double vision, eye pain Ear, nose and throat: No hearing loss, ear pain, nasal congestion, sore throat Cardiovascular:  No chest pain, palpitations Respiratory: No shortness of breath at rest or with exertion.   No wheezes GastrointestinaI: No nausea, vomiting, diarrhea, abdominal pain, fecal incontinence Genitourinary: No dysuria, urinary retention or frequency.  No nocturia. Musculoskeletal: No neck pain, back pain Integumentary: No rash, pruritus, skin lesions Neurological: as above Psychiatric: No depression at this time.  No anxiety Endocrine: No palpitations, diaphoresis, change in appetite, change in weigh or increased thirst Hematologic/Lymphatic: No anemia, purpura, petechiae. Allergic/Immunologic: No itchy/runny eyes, nasal congestion, recent allergic reactions, rashes  ALLERGIES: Allergies  Allergen Reactions  . Clarithromycin Rash  . Erythromycin Base Rash  . Moxifloxacin Swelling and Anxiety    lips swell    HOME MEDICATIONS:  Current Outpatient Medications:  .  Acetylcysteine (NAC PO), Take 1,000 mg by mouth daily., Disp: , Rfl:  .  Ascorbic Acid (VITAMIN C PO), Take by mouth., Disp: , Rfl:  .  B Complex Vitamins (VITAMIN-B COMPLEX) TABS, Take by mouth., Disp: , Rfl:  .  cetirizine (ZYRTEC) 10 MG tablet, Take 10 mg by mouth daily., Disp: , Rfl:  .  Cholecalciferol (VITAMIN D3) 1.25 MG (50000 UT) CAPS, TAKE 1 CAPSULE BY MOUTH ONE TIME PER WEEK, Disp: 12 capsule, Rfl: 2 .  cyclobenzaprine (FLEXERIL) 5 MG tablet, Take 1 tablet (5 mg total) by mouth 3 (three) times daily as needed for muscle spasms., Disp: 90 tablet, Rfl: 0 .  ibuprofen (ADVIL) 200 MG tablet, Take 4 tablets (800 mg total) by mouth every 8 (eight) hours as needed. (Patient taking differently: Take 200 mg by mouth every 8 (eight) hours as needed. ), Disp: 30 tablet, Rfl: 0 .  lidocaine (LIDODERM) 5 %, Place 1 patch onto the skin daily. Remove & Discard patch within 12 hours or as directed by MD, Disp: 30 patch, Rfl: 5 .  methylPREDNISolone (MEDROL) 4 MG tablet, Taper from 6 pills po for one day to 1 pill po the last day  over 6 days, Disp: 21 tablet, Rfl: 0 .  Multiple Vitamins-Minerals (MULTIVITAMIN ADULT PO), Take by mouth., Disp: , Rfl:  .  Omega-3 Fatty Acids (OMEGA 3 PO), Take by mouth., Disp: , Rfl:  .  omeprazole (PRILOSEC) 20 MG capsule, Take 20 mg by mouth daily., Disp: , Rfl:  .  traMADol (ULTRAM) 50 MG tablet, Take 2 tablets (100 mg total) by mouth every 6 (six) hours as needed for moderate pain., Disp: 60 tablet, Rfl: 0 .  VUMERITY 231 MG CPDR, TAKE 2 CAPSULES BY MOUTH 2 TIMES A DAY., Disp: 120 capsule, Rfl: 11  PAST MEDICAL HISTORY: Past Medical History:  Diagnosis Date  . Actinic dermatitis    sees Dr. Charlton Haws   . Allergy   . Endometriosis   . Low back pain   . Palpitations   . Routine gynecological examination    sees Dr. Billy Coast   . Seasonal allergies     PAST SURGICAL HISTORY: Past Surgical History:  Procedure  Laterality Date  . dysplasia     cervical ablation  . KNEE ARTHROSCOPY    . LAPAROSCOPY N/A 12/12/2013   Procedure: LAPAROSCOPY DIAGNOSTIC;  Surgeon: Meriel Pica, MD;  Location: [facility];  Service: Gynecology;  Laterality: N/A;  . lumbar ESI     Dr Jeral Fruit    FAMILY HISTORY: Family History  Problem Relation Age of Onset  . Hyperthyroidism Mother        Graves   . Graves' disease Mother   . Coronary artery disease Father   . Cirrhosis Father   . Multiple sclerosis Brother     SOCIAL HISTORY:  Social History   Socioeconomic History  . Marital status: Married    Spouse name: Not on file  . Number of children: 2  . Years of education: BA  . Highest education level: Not on file  Occupational History  . Occupation: Works from home  Tobacco Use  . Smoking status: Former Games developer  . Smokeless tobacco: Never Used  Substance and Sexual Activity  . Alcohol use: Yes    Alcohol/week: 0.0 standard drinks    Comment: Has 1 drink per day  . Drug use: No  . Sexual activity: Not on file  Other Topics Concern  . Not on file  Social History Narrative   Lives  with husband   Caffeine use: 1-2 cups per day   Left handed    Social Determinants of Health   Financial Resource Strain: Not on file  Food Insecurity: Not on file  Transportation Needs: Not on file  Physical Activity: Not on file  Stress: Not on file  Social Connections: Not on file  Intimate Partner Violence: Not on file     PHYSICAL EXAM  Vitals:   07/28/20 0825  BP: (!) 105/46  Pulse: 82  Weight: 115 lb 8 oz (52.4 kg)  Height: 5\' 2"  (1.575 m)    Body mass index is 21.13 kg/m.   General: The patient is well-developed and well-nourished and in no acute distress.      Neurologic Exam  Mental status: The patient is alert and oriented x 3 at the time of the examination. The patient has apparent normal recent and remote memory, with an apparently normal attention span and concentration ability.   Speech is normal.  Cranial nerves: Eye movements are normal.  Facial strength is normal..   No obvious hearing deficits are noted.  Motor:  Muscle bulk is normal.   Tone is normal. Strength is  5 / 5 in all 4 extremities.   Sensory: She no longer has a Lhermitte sign.  She has symmetric sensation to touch, temperature and vibration.  Coordination: Cerebellar testing shows good finger-nose-finger and heel-to-shin bilaterally.  Gait and station: Station is normal.  Gait is normal.  Tandem gait is normal.   Romberg is negative.  Reflexes: Deep tendon reflexes are symmetric and 3 bilaterally.       DIAGNOSTIC DATA (LABS, IMAGING, TESTING) - I reviewed patient records, labs, notes, testing and imaging myself where available.  Lab Results  Component Value Date   WBC 5.2 01/19/2020   HGB 14.6 01/19/2020   HCT 43.4 01/19/2020   MCV 96 01/19/2020   PLT 177 01/19/2020      Component Value Date/Time   NA 139 06/18/2018 1117   K 4.3 06/18/2018 1117   CL 102 06/18/2018 1117   CO2 22 06/18/2018 1117   GLUCOSE 80 06/18/2018 1117   GLUCOSE 89 03/11/2018 0934  BUN 11  06/18/2018 1117   CREATININE 0.77 06/18/2018 1117   CALCIUM 8.8 06/18/2018 1117   PROT 6.9 01/19/2020 1055   ALBUMIN 4.4 01/19/2020 1055   AST 17 01/19/2020 1055   ALT 14 01/19/2020 1055   ALKPHOS 38 (L) 01/19/2020 1055   BILITOT 0.5 01/19/2020 1055   GFRNONAA 93 06/18/2018 1117   GFRAA 107 06/18/2018 1117   Lab Results  Component Value Date   CHOL 167 03/11/2018   HDL 52.00 03/11/2018   LDLCALC 102 (H) 03/11/2018   TRIG 62.0 03/11/2018   CHOLHDL 3 03/11/2018   No results found for: HGBA1C Lab Results  Component Value Date   VITAMINB12 336 05/02/2018   Lab Results  Component Value Date   TSH 1.21 03/11/2018       ASSESSMENT AND PLAN    No diagnosis found.  1.   Continue Vumerity.   Check labs (CBCD, LFT).   2.   Check MRI later in the year. 3.   Stay active and exercise as possible 4.   rtc 6 months or sooner if new or worsening neurologic issues.   Richard A. Epimenio Foot, MD, PhD, FAAN Certified in Neurology, Clinical Neurophysiology, Sleep Medicine, Pain Medicine and Neuroimaging Director, [facility] at [facility]  [facility] [address], [address] [place], [place] [address] [phone]

## 2020-07-29 LAB — CBC WITH DIFFERENTIAL/PLATELET
Basophils Absolute: 0.1 10*3/uL (ref 0.0–0.2)
Basos: 1 %
EOS (ABSOLUTE): 0.1 10*3/uL (ref 0.0–0.4)
Eos: 2 %
Hematocrit: 41.6 % (ref 34.0–46.6)
Hemoglobin: 13.7 g/dL (ref 11.1–15.9)
Immature Grans (Abs): 0 10*3/uL (ref 0.0–0.1)
Immature Granulocytes: 0 %
Lymphocytes Absolute: 1.2 10*3/uL (ref 0.7–3.1)
Lymphs: 25 %
MCH: 31.6 pg (ref 26.6–33.0)
MCHC: 32.9 g/dL (ref 31.5–35.7)
MCV: 96 fL (ref 79–97)
Monocytes Absolute: 0.5 10*3/uL (ref 0.1–0.9)
Monocytes: 9 %
Neutrophils Absolute: 3.1 10*3/uL (ref 1.4–7.0)
Neutrophils: 63 %
Platelets: 160 10*3/uL (ref 150–450)
RBC: 4.33 x10E6/uL (ref 3.77–5.28)
RDW: 12.3 % (ref 11.7–15.4)
WBC: 5 10*3/uL (ref 3.4–10.8)

## 2020-07-29 LAB — HEPATIC FUNCTION PANEL
ALT: 12 IU/L (ref 0–32)
AST: 19 IU/L (ref 0–40)
Albumin: 4.6 g/dL (ref 3.8–4.8)
Alkaline Phosphatase: 38 IU/L — ABNORMAL LOW (ref 44–121)
Bilirubin Total: 0.4 mg/dL (ref 0.0–1.2)
Bilirubin, Direct: 0.14 mg/dL (ref 0.00–0.40)
Total Protein: 6.7 g/dL (ref 6.0–8.5)

## 2020-08-23 ENCOUNTER — Other Ambulatory Visit: Payer: Self-pay

## 2020-08-24 ENCOUNTER — Encounter: Payer: Self-pay | Admitting: Family Medicine

## 2020-08-24 ENCOUNTER — Ambulatory Visit (INDEPENDENT_AMBULATORY_CARE_PROVIDER_SITE_OTHER): Payer: Managed Care, Other (non HMO) | Admitting: Family Medicine

## 2020-08-24 VITALS — BP 96/58 | HR 72 | Temp 98.2°F | Ht 62.0 in | Wt 116.0 lb

## 2020-08-24 DIAGNOSIS — Z0000 Encounter for general adult medical examination without abnormal findings: Secondary | ICD-10-CM

## 2020-08-24 LAB — VITAMIN D 25 HYDROXY (VIT D DEFICIENCY, FRACTURES): VITD: 50.65 ng/mL (ref 30.00–100.00)

## 2020-08-24 LAB — CBC WITH DIFFERENTIAL/PLATELET
Basophils Absolute: 0.1 10*3/uL (ref 0.0–0.1)
Basophils Relative: 1.8 % (ref 0.0–3.0)
Eosinophils Absolute: 0.1 10*3/uL (ref 0.0–0.7)
Eosinophils Relative: 1.9 % (ref 0.0–5.0)
HCT: 41.1 % (ref 36.0–46.0)
Hemoglobin: 14.3 g/dL (ref 12.0–15.0)
Lymphocytes Relative: 27.4 % (ref 12.0–46.0)
Lymphs Abs: 1.3 10*3/uL (ref 0.7–4.0)
MCHC: 34.7 g/dL (ref 30.0–36.0)
MCV: 90.7 fl (ref 78.0–100.0)
Monocytes Absolute: 0.3 10*3/uL (ref 0.1–1.0)
Monocytes Relative: 7.2 % (ref 3.0–12.0)
Neutro Abs: 3 10*3/uL (ref 1.4–7.7)
Neutrophils Relative %: 61.7 % (ref 43.0–77.0)
Platelets: 155 10*3/uL (ref 150.0–400.0)
RBC: 4.53 Mil/uL (ref 3.87–5.11)
RDW: 12.8 % (ref 11.5–15.5)
WBC: 4.8 10*3/uL (ref 4.0–10.5)

## 2020-08-24 LAB — LIPID PANEL
Cholesterol: 185 mg/dL (ref 0–200)
HDL: 69.8 mg/dL (ref >39.00)
LDL Cholesterol: 105 mg/dL — ABNORMAL HIGH (ref 0–99)
NonHDL: 114.91
Total CHOL/HDL Ratio: 3
Triglycerides: 50 mg/dL (ref 0.0–149.0)
VLDL: 10 mg/dL (ref 0.0–40.0)

## 2020-08-24 LAB — URINALYSIS
Bilirubin Urine: NEGATIVE
Ketones, ur: NEGATIVE
Leukocytes,Ua: NEGATIVE
Nitrite: NEGATIVE
Specific Gravity, Urine: 1.015 (ref 1.000–1.030)
Total Protein, Urine: NEGATIVE
Urine Glucose: NEGATIVE
Urobilinogen, UA: 0.2 (ref 0.0–1.0)
pH: 6 (ref 5.0–8.0)

## 2020-08-24 LAB — BASIC METABOLIC PANEL
BUN: 12 mg/dL (ref 6–23)
CO2: 28 mEq/L (ref 19–32)
Calcium: 9.4 mg/dL (ref 8.4–10.5)
Chloride: 104 mEq/L (ref 96–112)
Creatinine, Ser: 0.67 mg/dL (ref 0.40–1.20)
GFR: 103.02 mL/min (ref >60.00)
Glucose, Bld: 76 mg/dL (ref 70–99)
Potassium: 4.1 mEq/L (ref 3.5–5.1)
Sodium: 138 mEq/L (ref 135–145)

## 2020-08-24 LAB — TSH: TSH: 1.78 u[IU]/mL (ref 0.35–4.50)

## 2020-08-24 LAB — HEPATIC FUNCTION PANEL
ALT: 11 U/L (ref 0–35)
AST: 13 U/L (ref 0–37)
Albumin: 4.6 g/dL (ref 3.5–5.2)
Alkaline Phosphatase: 32 U/L — ABNORMAL LOW (ref 39–117)
Bilirubin, Direct: 0.1 mg/dL (ref 0.0–0.3)
Total Bilirubin: 0.5 mg/dL (ref 0.2–1.2)
Total Protein: 7.1 g/dL (ref 6.0–8.3)

## 2020-08-24 LAB — T3, FREE: T3, Free: 2.8 pg/mL (ref 2.3–4.2)

## 2020-08-24 LAB — HEMOGLOBIN A1C: Hgb A1c MFr Bld: 4.9 % (ref 4.6–6.5)

## 2020-08-24 LAB — T4, FREE: Free T4: 1.04 ng/dL (ref 0.60–1.60)

## 2020-08-24 NOTE — Progress Notes (Signed)
   Subjective:    Patient ID: [name], female    DOB: 1970/11/24, 50 y.o.   MRN: [id number]  HPI Here for a well exam. She feels well in general. Her MS has been fairly stable. 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 She does mention occasional leg and foot cramps at night which cause her to get up and walk around before they stop.   Review of Systems  Constitutional: Negative.   HENT: Negative.   Eyes: Negative.   Respiratory: Negative.   Cardiovascular: Negative.   Gastrointestinal: Negative.   Genitourinary: Negative for decreased urine volume, difficulty urinating, dyspareunia, dysuria, enuresis, flank pain, frequency, hematuria, pelvic pain and urgency.  Musculoskeletal: Negative.   Skin: Negative.   Neurological: Negative.   Psychiatric/Behavioral: Negative.        Objective:   Physical Exam Constitutional:      General: She is not in acute distress.    Appearance: Normal appearance. She is well-developed.  HENT:     Head: Normocephalic and atraumatic.     Right Ear: External ear normal.     Left Ear: External ear normal.     Nose: Nose normal.     Mouth/Throat:     Pharynx: No oropharyngeal exudate.  Eyes:     General: No scleral icterus.    Conjunctiva/sclera: Conjunctivae normal.     Pupils: Pupils are equal, round, and reactive to light.  Neck:     Thyroid: No thyromegaly.     Vascular: No JVD.  Cardiovascular:     Rate and Rhythm: Normal rate and regular rhythm.     Heart sounds: Normal heart sounds. No murmur heard. No friction rub. No gallop.   Pulmonary:     Effort: Pulmonary effort is normal. No respiratory distress.     Breath sounds: Normal breath sounds. No wheezing or rales.  Chest:     Chest wall: No tenderness.  Abdominal:     General: Bowel sounds are normal. There is no distension.     Palpations: Abdomen is soft. There is no mass.     Tenderness: There is no abdominal tenderness. There is no guarding or rebound.   Musculoskeletal:        General: No tenderness. Normal range of motion.     Cervical back: Normal range of motion and neck supple.  Lymphadenopathy:     Cervical: No cervical adenopathy.  Skin:    General: Skin is warm and dry.     Findings: No erythema or rash.  Neurological:     Mental Status: She is alert and oriented to person, place, and time.     Cranial Nerves: No cranial nerve deficit.     Motor: No abnormal muscle tone.     Coordination: Coordination normal.     Deep Tendon Reflexes: Reflexes are normal and symmetric. Reflexes normal.  Psychiatric:        Behavior: Behavior normal.        Thought Content: Thought content normal.        Judgment: Judgment normal.           Assessment & Plan:  Well exam. We discussed diet and exercise. Get fasting labs. For the leg cramps, I suggested she take 400-800 mg of magnesium at bedtime.  Gershon Crane, MD

## 2020-10-29 IMAGING — CT CT NECK W/ CM
3 series · 8 of 14 positions shown, 9 images · IV contrast (iopamidol)
Comparison: None.

CLINICAL DATA: Left neck mass.

EXAM:
CT NECK WITH CONTRAST
TECHNIQUE: Multidetector CT imaging of the neck was performed using the
standard protocol following the bolus administration of intravenous
contrast.
CONTRAST:  75mL CH9IBX-K22 IOPAMIDOL (CH9IBX-K22) INJECTION 61%

[Series 2: neck · axial · 0.45mm/px · z∈[-207,-127]mm · 2 of 120 slices shown]
[im 40/120  bone]
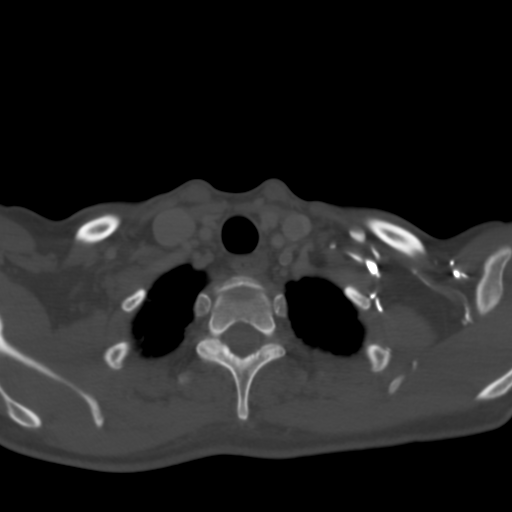
[im 80/120  bone]
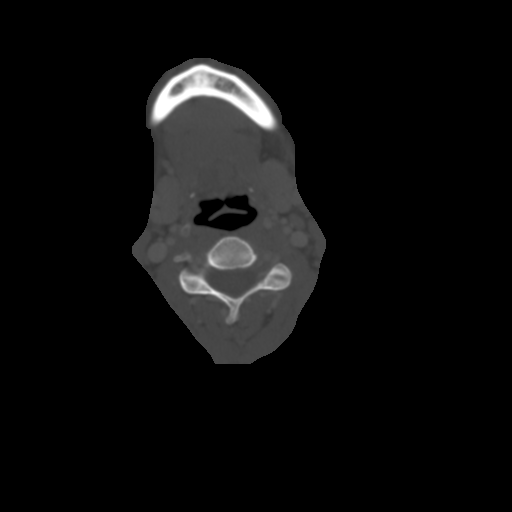

[Series 5: bone · axial · 0.45mm/px · z∈[-225,-107]mm · 3 of 119 slices shown, 4 images]
[im 30/119  soft-tissue]
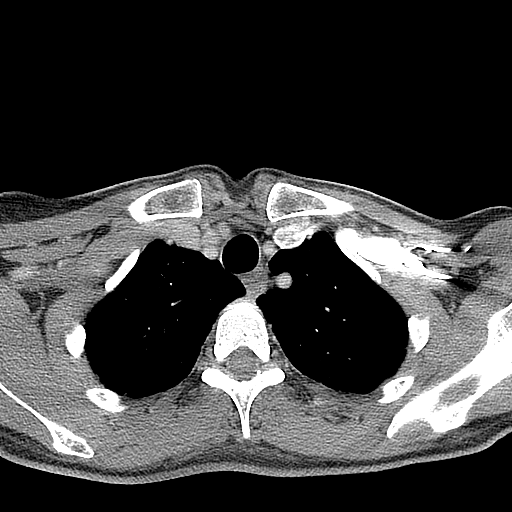
[im 30/119  bone]
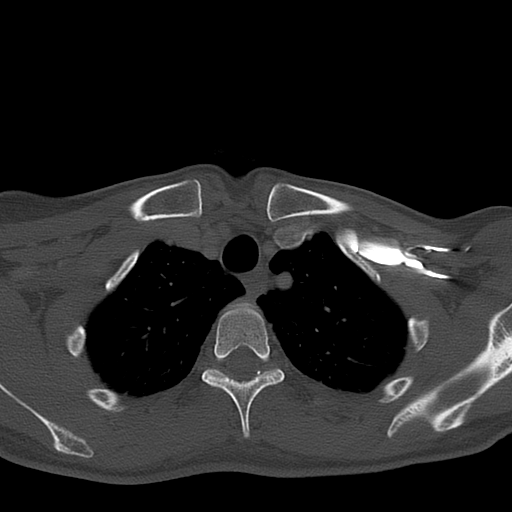
[im 60/119  bone]
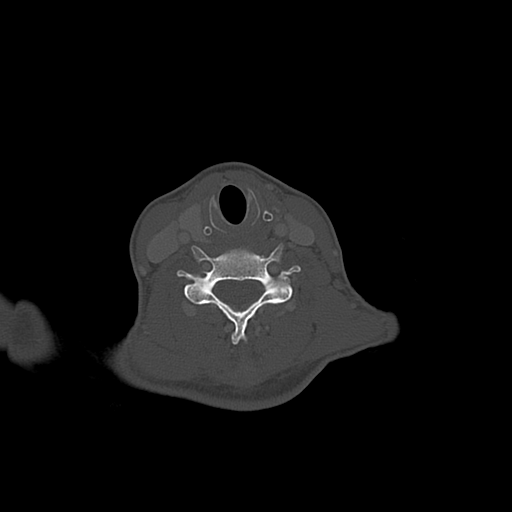
[im 89/119  bone]
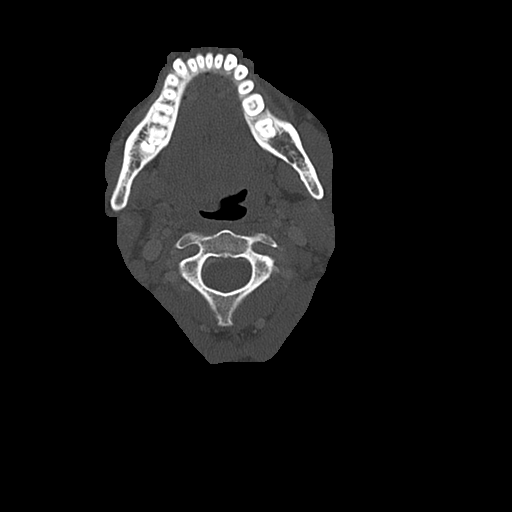

[Series 8: angled axial-oropharynx · axial · 0.39mm/px · z∈[-243,-129]mm · 3 of 118 slices shown]
[im 30/118  bone]
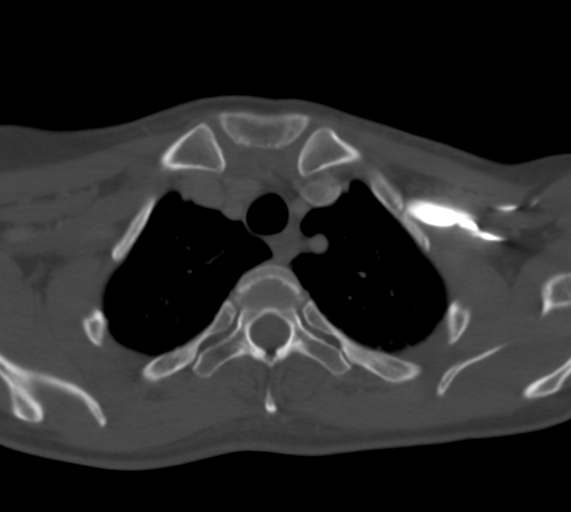
[im 59/118  bone]
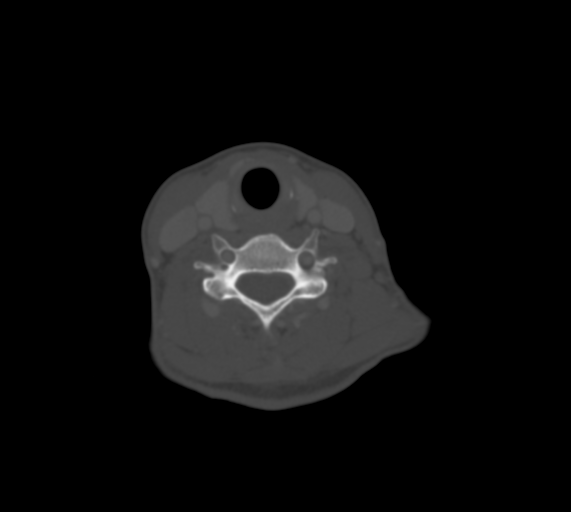
[im 88/118  bone]
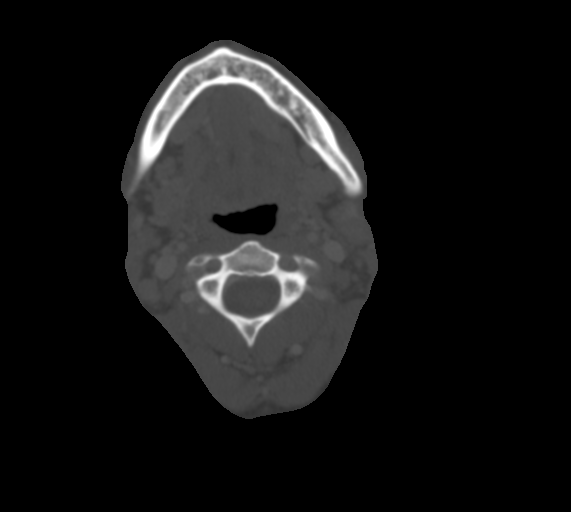

[8 of 14 positions shown; findings below may reference images not displayed]

FINDINGS: Pharynx and larynx: Normal. No mass or swelling.

Salivary glands: No inflammation, mass, or stone.

Thyroid: Thyroid mildly enlarged bilaterally. Scattered small
hypodense nodules in the thyroid measuring up to 4 mm on the left
and 6 mm on the right. No calcifications. No further imaging
necessary based on nodule size.

Lymph nodes: No enlarged lymph nodes in the neck.

Vascular: Normal vascular enhancement.

Limited intracranial: Negative

Visualized orbits: Negative

Mastoids and visualized paranasal sinuses: Paranasal sinuses clear.
Mastoid clear bilaterally.

Skeleton: No focal skeletal abnormality.

Upper chest: Lung apices clear bilaterally.

Other: None
IMPRESSION: Negative CT neck.  No mass or adenopathy.

Mild thyromegaly with small nodules bilaterally.

## 2020-12-23 ENCOUNTER — Other Ambulatory Visit: Payer: Self-pay | Admitting: Family Medicine

## 2021-01-17 ENCOUNTER — Encounter: Payer: Self-pay | Admitting: Family Medicine

## 2021-01-17 ENCOUNTER — Other Ambulatory Visit: Payer: Self-pay

## 2021-01-17 ENCOUNTER — Ambulatory Visit: Payer: Managed Care, Other (non HMO) | Admitting: Family Medicine

## 2021-01-17 VITALS — BP 96/68 | HR 63 | Temp 98.4°F | Wt 116.5 lb

## 2021-01-17 DIAGNOSIS — K219 Gastro-esophageal reflux disease without esophagitis: Secondary | ICD-10-CM | POA: Insufficient documentation

## 2021-01-17 MED ORDER — FAMOTIDINE 40 MG PO TABS: 40.0000 mg | ORAL_TABLET | Freq: Every day | ORAL | 2 refills | Status: DC

## 2021-01-17 MED ORDER — OMEPRAZOLE 40 MG PO CPDR: 40.0000 mg | DELAYED_RELEASE_CAPSULE | Freq: Every morning | ORAL | 3 refills | Status: DC

## 2021-01-17 NOTE — Progress Notes (Signed)
   Subjective:    Patient ID: [name], female    DOB: 11-03-1970, 50 y.o.   MRN: [id number]  HPI Here for 3 weeks of a frequent pressure sensation in the chest. This is not related to exertion. She has no SOB. She has known GERD which can cause her voice to be hoarse. She has seen Dr. Pollyann Kennedy for this. He suggested she see her PCP and a GI for this. Consequently she saw Rochele Raring PA at [facility] this morning and she started her on Omeprazole 40 mg daily. Kacie now asks our opinion. She has changed her diet and her has reduced her intake of alcohol and coffee.   Review of Systems  Constitutional: Negative.   HENT:  Positive for voice change. Negative for trouble swallowing.   Respiratory: Negative.    Cardiovascular:  Positive for chest pain. Negative for palpitations and leg swelling.  Gastrointestinal:  Negative for abdominal distention, abdominal pain, blood in stool, constipation, diarrhea, nausea and vomiting.      Objective:   Physical Exam Constitutional:      Appearance: Normal appearance.  Cardiovascular:     Rate and Rhythm: Normal rate and regular rhythm.     Pulses: Normal pulses.     Heart sounds: Normal heart sounds.  Pulmonary:     Effort: Pulmonary effort is normal.     Breath sounds: Normal breath sounds.  Abdominal:     General: Abdomen is flat. Bowel sounds are normal. There is no distension.     Palpations: Abdomen is soft. There is no mass.     Tenderness: There is no abdominal tenderness. There is no guarding or rebound.     Hernia: No hernia is present.  Neurological:     Mental Status: She is alert.          Assessment & Plan:  Her chest tightness is almost certainly due to GERD. She will take the Omeprazole every morning, and we will add Pepcid 40 mg every evening. Recheck in one month.  Gershon Crane, MD

## 2021-01-25 ENCOUNTER — Telehealth: Payer: Self-pay | Admitting: Neurology

## 2021-01-25 DIAGNOSIS — G35 Multiple sclerosis: Secondary | ICD-10-CM

## 2021-01-25 NOTE — Telephone Encounter (Signed)
Ladies this Pt has an OF visit with Dr. Epimenio Foot 9/7 and she said she thought this was her annual MRI appt. I advised that if the MRI is needed you will advise and possibly r/s the 9/7 appt. Thank you

## 2021-01-25 NOTE — Addendum Note (Signed)
Addended by: Geronimo Running A on: 01/25/2021 04:29 PM   Modules accepted: Orders

## 2021-01-25 NOTE — Telephone Encounter (Signed)
I called patient.  She reports that sometimes the MRI is ordered prior to her appointment.  I advised her that since her appointment is next week it is unlikely that her MRI will be scheduled prior to then.  Patient would prefer that I still speak with Dr. Epimenio Foot about this.  I spoke with Dr. Epimenio Foot.  He is agreeable to ordering MRI brain with and without contrast prior to her appointment next week, although it is unlikely to be completed.  I called patient.  I discussed this with her.  Patient verbalized understanding and appreciation.

## 2021-02-01 ENCOUNTER — Telehealth: Payer: Self-pay | Admitting: Neurology

## 2021-02-01 NOTE — Telephone Encounter (Signed)
MR Brain w/wo contrast Dr. Fredonia Highland: [id number] (exp. 01/27/21 to 07/26/21). Patient is scheduled at [facility] for 02/09/21.

## 2021-02-02 ENCOUNTER — Other Ambulatory Visit: Payer: Self-pay

## 2021-02-02 ENCOUNTER — Ambulatory Visit: Payer: Managed Care, Other (non HMO) | Admitting: Neurology

## 2021-02-02 ENCOUNTER — Encounter: Payer: Self-pay | Admitting: Neurology

## 2021-02-02 VITALS — BP 96/62 | HR 78 | Ht 62.0 in | Wt 115.0 lb

## 2021-02-02 DIAGNOSIS — R29818 Other symptoms and signs involving the nervous system: Secondary | ICD-10-CM

## 2021-02-02 DIAGNOSIS — Z79899 Other long term (current) drug therapy: Secondary | ICD-10-CM

## 2021-02-02 DIAGNOSIS — G35 Multiple sclerosis: Secondary | ICD-10-CM | POA: Diagnosis not present

## 2021-02-02 DIAGNOSIS — E559 Vitamin D deficiency, unspecified: Secondary | ICD-10-CM

## 2021-02-02 DIAGNOSIS — R200 Anesthesia of skin: Secondary | ICD-10-CM

## 2021-02-02 NOTE — Progress Notes (Signed)
[facility]  PATIENT: [name] DOB: 10-26-1970  REFERRING DOCTOR OR PCP:  Gershon Crane SOURCE: patient, notes from Dr. Clent Ridges  _________________________________   HISTORICAL  CHIEF COMPLAINT:  Chief Complaint  Patient presents with   Follow-up    Rm 2, alone. Here for 6 month MS f/u, on Vumerity. Pt reports doing well, no new or worsening in sx.     HISTORY OF PRESENT ILLNESS:  [name] 50 y.o. woman diagnosed with relapsing remitting MS after her 06/10/2018 visit when she presented with numbness  Update 02/02/2021: Her MS is stable and she has no exacerbation.    She is on Vumerity and tolerates it well.   She has some fluctuations in numbness but otherwise notes no new symptoms. MRI of the brain performed 12/2019 showed no new MS lesions.  Of note, her initial exacerbation was most likely due to to the focus in the spinal cord adjacent to C2.   She has another MRI scheduled 02/09/21  Gait is doing well and she has no falls.  She has no problems with balance and goes up and down stairs well.   She has no weakness.  She has some tingling and numbness in the right foot and occasionally will have a Lhermitte sign.  Otherwise sensation is fine.   Bladder function is the same -- some urge incontinence especially before her period over the last few years is stable.   Vision is doing well.    She has some fatigue, especially if more active.   .     She denies depression.   Insomnia is better.   She notes mild cognitive issues with reduced focus and attention.  These are stable.    She has had dry eyes (positive test at ophth) and dry mouth over the last year and uses eye drops.   SSA/SSB were negative though ANA was 1:320 with a nucleolar pattern.     MS history: She had the onset of numbness in both hands with a tingling sensation in mid November 2019.  She had a Lhermitte sign.  The tingling was uncomfortable at times but not painful.    She also had numbness in  her right foot and ankle.  MRI of the cervical spine showed a nonenhancing focus adjacent to C1-C2 and MRI of the brain showed about a dozen T2 hyperintense foci consistent with MS.  Medications: She started Vumerity February 2020:  Family history: Her brother has active secondary progressive MS, diagnosed with MS 4-5 years ago but symptomatic for many more years..  Imaging studies: MRI of the brain 01/13/2020 shows T2/FLAIR hyperintense foci predominantly in the periventricular white matter of the hemispheres.  The pattern and configuration is consistent with chronic demyelinating plaque associated with multiple sclerosis.  None of the foci enhances or appears to be acute.  They were all present and appear unchanged on the MRI from 01/15/2019.  MRI of the brain 01/15/2019 shows about a dozen T2/flair hyperintense foci in the hemispheres in the periventricular deep white matter.  These are consistent with chronic demyelinating plaque associated with multiple sclerosis.  MRI of the cervical spine 06/12/2018 shows a nonenhancing T2 hyperintense focus in the posterior spinal cord adjacent to C2. This is consistent with a demyelinating plaque associated with multiple sclerosis.   There are mild multilevel degenerative changes as detailed above.  This causes mild spinal stenosis at C4-C5 and C5-C6.  There is no nerve root compression  Pertinent labs: On 06/18/2018, hep  B and hep C serology was negative.  QuantiFERON-TB was negative.  HIV was negative.  Varicella-zoster IgG is positive (2259).  Cytochrome P450 2C9 is *1/*3.    REVIEW OF SYSTEMS: Constitutional: No fevers, chills, sweats, or change in appetite Eyes: No visual changes, double vision, eye pain Ear, nose and throat: No hearing loss, ear pain, nasal congestion, sore throat Cardiovascular: No chest pain, palpitations Respiratory:  No shortness of breath at rest or with exertion.   No wheezes GastrointestinaI: No nausea, vomiting, diarrhea,  abdominal pain, fecal incontinence Genitourinary:  No dysuria, urinary retention or frequency.  No nocturia. Musculoskeletal:  No neck pain, back pain Integumentary: No rash, pruritus, skin lesions Neurological: as above Psychiatric: No depression at this time.  No anxiety Endocrine: No palpitations, diaphoresis, change in appetite, change in weigh or increased thirst Hematologic/Lymphatic:  No anemia, purpura, petechiae. Allergic/Immunologic: No itchy/runny eyes, nasal congestion, recent allergic reactions, rashes  ALLERGIES: Allergies  Allergen Reactions   Clarithromycin Rash   Erythromycin Base Rash   Moxifloxacin Swelling and Anxiety    lips swell    HOME MEDICATIONS:  Current Outpatient Medications:    Acetylcysteine (NAC PO), Take 1,000 mg by mouth daily., Disp: , Rfl:    Ascorbic Acid (VITAMIN C PO), Take by mouth., Disp: , Rfl:    B Complex Vitamins (VITAMIN-B COMPLEX) TABS, Take by mouth., Disp: , Rfl:    cetirizine (ZYRTEC) 10 MG tablet, Take 10 mg by mouth daily., Disp: , Rfl:    Cholecalciferol (VITAMIN D3) 1.25 MG (50000 UT) CAPS, TAKE 1 CAPSULE BY MOUTH ONE TIME PER WEEK, Disp: 12 capsule, Rfl: 0   famotidine (PEPCID) 40 MG tablet, Take 1 tablet (40 mg total) by mouth daily before supper., Disp: 30 tablet, Rfl: 2   ibuprofen (ADVIL) 200 MG tablet, Take 4 tablets (800 mg total) by mouth every 8 (eight) hours as needed. (Patient taking differently: Take 200 mg by mouth every 8 (eight) hours as needed.), Disp: 30 tablet, Rfl: 0   Multiple Vitamins-Minerals (MULTIVITAMIN ADULT PO), Take by mouth., Disp: , Rfl:    Omega-3 Fatty Acids (OMEGA 3 PO), Take by mouth., Disp: , Rfl:    omeprazole (PRILOSEC) 40 MG capsule, Take 1 capsule (40 mg total) by mouth in the morning., Disp: 30 capsule, Rfl: 3   TYRVAYA 0.03 MG/ACT SOLN, Place 1 spray into the nose 2 (two) times daily., Disp: , Rfl:    VUMERITY 231 MG CPDR, TAKE 2 CAPSULES BY MOUTH 2 TIMES A DAY., Disp: 120 capsule, Rfl:  11  PAST MEDICAL HISTORY: Past Medical History:  Diagnosis Date   Actinic dermatitis    sees Dr. Charlton Haws    Allergy    Endometriosis    Low back pain    Multiple sclerosis (HCC)    sees Dr. Despina Arias    Neuromuscular disorder [facility])    has MS    Palpitations    Routine gynecological examination    sees Dr. Billy Coast    Seasonal allergies     PAST SURGICAL HISTORY: Past Surgical History:  Procedure Laterality Date   dysplasia     cervical ablation   KNEE ARTHROSCOPY     LAPAROSCOPY N/A 12/12/2013   Procedure: LAPAROSCOPY DIAGNOSTIC;  Surgeon: Meriel Pica, MD;  Location: [facility];  Service: Gynecology;  Laterality: N/A;   lumbar ESI     Dr Jeral Fruit    FAMILY HISTORY: Family History  Problem Relation Age of Onset   Hyperthyroidism Mother  Graves    Graves' disease Mother    Coronary artery disease Father    Cirrhosis Father    Multiple sclerosis Brother     SOCIAL HISTORY:  Social History   Socioeconomic History   Marital status: Married    Spouse name: Not on file   Number of children: 2   Years of education: BA   Highest education level: Not on file  Occupational History   Occupation: Works from home  Tobacco Use   Smoking status: Former   Smokeless tobacco: Never  Substance and Sexual Activity   Alcohol use: Yes    Alcohol/week: 0.0 standard drinks    Comment: Has 1 drink per day   Drug use: No   Sexual activity: Not on file  Other Topics Concern   Not on file  Social History Narrative   Lives with husband   Caffeine use: 1-2 cups per day   Left handed    Social Determinants of Health   Financial Resource Strain: Not on file  Food Insecurity: Not on file  Transportation Needs: Not on file  Physical Activity: Not on file  Stress: Not on file  Social Connections: Not on file  Intimate Partner Violence: Not on file     PHYSICAL EXAM  Vitals:   02/02/21 0816  BP: 96/62  Pulse: 78  Weight: 115 lb (52.2 kg)  Height: 5\' 2"   (1.575 m)    Body mass index is 21.03 kg/m.   General: The patient is well-developed and well-nourished and in no acute distress.      Neurologic Exam  Mental status: The patient is alert and oriented x 3 at the time of the examination. The patient has apparent normal recent and remote memory, with an apparently normal attention span and concentration ability.   Speech is normal.  Cranial nerves: Eye movements are normal.  Facial strength is normal..   No obvious hearing deficits are noted.  Motor:  Muscle bulk is normal.   Tone is normal. Strength is  5 / 5 in all 4 extremities.   Sensory: She no longer has a Lhermitte sign.  She has symmetric sensation to touch, temperature and vibration.  Coordination: Cerebellar testing shows good finger-nose-finger and heel-to-shin bilaterally.  Gait and station: Station is normal.  Gait is normal.  Tandem gait is normal.   Romberg is negative.  Reflexes: Deep tendon reflexes are symmetric and normal, to the arms and 3 in the legs bilaterally.       DIAGNOSTIC DATA (LABS, IMAGING, TESTING) - I reviewed patient records, labs, notes, testing and imaging myself where available.  Lab Results  Component Value Date   WBC 4.8 08/24/2020   HGB 14.3 08/24/2020   HCT 41.1 08/24/2020   MCV 90.7 08/24/2020   PLT 155.0 08/24/2020      Component Value Date/Time   NA 138 08/24/2020 0929   NA 139 06/18/2018 1117   K 4.1 08/24/2020 0929   CL 104 08/24/2020 0929   CO2 28 08/24/2020 0929   GLUCOSE 76 08/24/2020 0929   BUN 12 08/24/2020 0929   BUN 11 06/18/2018 1117   CREATININE 0.67 08/24/2020 0929   CALCIUM 9.4 08/24/2020 0929   PROT 7.1 08/24/2020 0929   PROT 6.7 07/28/2020 0919   ALBUMIN 4.6 08/24/2020 0929   ALBUMIN 4.6 07/28/2020 0919   AST 13 08/24/2020 0929   ALT 11 08/24/2020 0929   ALKPHOS 32 (L) 08/24/2020 0929   BILITOT 0.5 08/24/2020 0929   BILITOT  0.4 07/28/2020 0919   GFRNONAA 93 06/18/2018 1117   GFRAA 107 06/18/2018  1117   Lab Results  Component Value Date   CHOL 185 08/24/2020   HDL 69.80 08/24/2020   LDLCALC 105 (H) 08/24/2020   TRIG 50.0 08/24/2020   CHOLHDL 3 08/24/2020   Lab Results  Component Value Date   HGBA1C 4.9 08/24/2020   Lab Results  Component Value Date   VITAMINB12 336 05/02/2018   Lab Results  Component Value Date   TSH 1.78 08/24/2020       ASSESSMENT AND PLAN    1. Multiple sclerosis (HCC)   2. Lhermitte's sign positive   3. Vitamin D deficiency   4. Numbness   5. High risk medication use     1.   Continue Vumerity.   She has recent lab work.  Lymphocyte count has been fine. 2.   MRI is scheduled for next week.  We will let her know the results.  If it does show breakthrough activity we would need to consider a different disease modifying therapy. 3.   Stay active and exercise as possible 4.   We had a discussion about healthy nutrition and MS.   5.   rtc 6 months or sooner if new or worsening neurologic issues.   Conda Wannamaker A. Epimenio Foot, MD, PhD, FAAN Certified in Neurology, Clinical Neurophysiology, Sleep Medicine, Pain Medicine and Neuroimaging Director, [facility]  [facility] [address] [place], [place] [address] [phone]

## 2021-02-09 ENCOUNTER — Ambulatory Visit: Payer: Managed Care, Other (non HMO)

## 2021-02-09 DIAGNOSIS — G35 Multiple sclerosis: Secondary | ICD-10-CM | POA: Diagnosis not present

## 2021-02-09 MED ORDER — GADOBENATE DIMEGLUMINE 529 MG/ML IV SOLN
10.0000 mL | Freq: Once | INTRAVENOUS | Status: AC | PRN
Start: 2021-02-09 — End: 2021-02-09
  Administered 2021-02-09: 10 mL via INTRAVENOUS

## 2021-03-18 ENCOUNTER — Other Ambulatory Visit: Payer: Self-pay | Admitting: Family Medicine

## 2021-05-02 ENCOUNTER — Telehealth: Payer: Self-pay | Admitting: *Deleted

## 2021-05-02 NOTE — Telephone Encounter (Signed)
Faxed completed/signed PA vumerity to [facility] at [phone]. Received fax confirmation. Waiting on determination.

## 2021-05-03 NOTE — Telephone Encounter (Signed)
Received fax from [facility] that PA approved 05/02/21-05/02/22.

## 2021-05-12 ENCOUNTER — Telehealth: Payer: Self-pay | Admitting: *Deleted

## 2021-05-12 NOTE — Telephone Encounter (Signed)
Submitted PA Vumerity on CMM. Key: [id number]. Waiting on determination from [facility] Non-Medicare

## 2021-05-16 ENCOUNTER — Telehealth: Payer: Self-pay | Admitting: Family Medicine

## 2021-05-16 MED ORDER — OSELTAMIVIR PHOSPHATE 75 MG PO CAPS
75.0000 mg | ORAL_CAPSULE | Freq: Two times a day (BID) | ORAL | 0 refills | Status: DC
Start: 2021-05-16 — End: 2021-08-09

## 2021-05-16 NOTE — Telephone Encounter (Signed)
Patient called because son was diagnosed with Flu. Son's pediatrician recommended patient get a prescription for Tamiflu for herself as preventative measures since she has MS. Patient is not having any symptoms at this time  Please send to  [id number] - [place], [place] - [address] Phone:  [phone]  Fax:  [phone]       Good callback number is [phone]    Please advise

## 2021-05-16 NOTE — Telephone Encounter (Addendum)
Patient aware that Tamiflu 75mg  has been [facility] in [facility]

## 2021-05-16 NOTE — Telephone Encounter (Signed)
Last OV- 01/17/21.  Pharmacy updated.   Please advise

## 2021-05-16 NOTE — Telephone Encounter (Signed)
Received fax from [facility] that no PA needed.

## 2021-05-16 NOTE — Telephone Encounter (Signed)
Call in Tamiflu 75 mg BID for 5 days  

## 2021-05-17 ENCOUNTER — Other Ambulatory Visit: Payer: Self-pay | Admitting: Neurology

## 2021-06-02 ENCOUNTER — Encounter: Payer: Self-pay | Admitting: Neurology

## 2021-06-15 LAB — HM MAMMOGRAPHY

## 2021-07-05 ENCOUNTER — Encounter: Payer: Self-pay | Admitting: Family Medicine

## 2021-07-06 ENCOUNTER — Encounter: Payer: Self-pay | Admitting: Family Medicine

## 2021-08-01 ENCOUNTER — Other Ambulatory Visit: Payer: Self-pay | Admitting: Family Medicine

## 2021-08-09 ENCOUNTER — Ambulatory Visit: Payer: Managed Care, Other (non HMO) | Admitting: Neurology

## 2021-08-09 ENCOUNTER — Encounter: Payer: Self-pay | Admitting: Neurology

## 2021-08-09 VITALS — BP 110/71 | HR 71 | Ht 62.0 in | Wt 117.0 lb

## 2021-08-09 DIAGNOSIS — G4700 Insomnia, unspecified: Secondary | ICD-10-CM | POA: Diagnosis not present

## 2021-08-09 DIAGNOSIS — R200 Anesthesia of skin: Secondary | ICD-10-CM

## 2021-08-09 DIAGNOSIS — Z79899 Other long term (current) drug therapy: Secondary | ICD-10-CM

## 2021-08-09 DIAGNOSIS — G35 Multiple sclerosis: Secondary | ICD-10-CM | POA: Diagnosis not present

## 2021-08-09 NOTE — Progress Notes (Signed)
? ?[facility] ? ?PATIENT: [name] ?DOB: November 19, 1970 ? ?REFERRING DOCTOR OR PCP:  Gershon Crane ?SOURCE: patient, notes from Dr. Clent Ridges ? ?_________________________________ ? ? ?HISTORICAL ? ?CHIEF COMPLAINT:  ?Chief Complaint  ?Patient presents with  ? Follow-up  ?  Pt alone, rm 2. Overall stable and doing well. Denies any issues or concerns. DMT- Vumerity.   ? ? ?HISTORY OF PRESENT ILLNESS:  ?[name] 51 y.o. woman diagnosed with relapsing remitting MS after her 06/10/2018 visit when she presented with numbness ? ?Update 08/09/2021: ?Her MS is stable and she has no exacerbation.    She is on Vumerity and tolerates it well with only rare flushing.    ? ?MRI of the brain performed September 2022 showed no new MS lesions.   ? ?She denies any new neurologic symptoms.  Her gait and balance are doing well.   She occasionally has numbness in her feet and hands    She has no problems with balance and goes up and down stairs well.   She has no weakness.   Bladder function is the same -- some urge incontinence especially before her period over the last few years is stable.   Vision is doing well.   ? ?She has some fatigue in the late mornings, especially if more active.  She denies depression.   Insomnia is better.   She denies signififcant cognitive issues - occasional reduced focus and attention.   ?  ?She has had dry eyes (positive test at ophth) and dry mouth over the last year and uses eye drops.     SSA/SSB were negative though ANA was 1:320 with a nucleolar pattern.   ? ? ?MS history: ?She had the onset of numbness in both hands with a tingling sensation in mid November 2019.  She had a Lhermitte sign.  The tingling was uncomfortable at times but not painful.    She also had numbness in her right foot and ankle.  MRI of the cervical spine showed a nonenhancing focus adjacent to C1-C2 and MRI of the brain showed about a dozen T2 hyperintense foci consistent with MS. ? ?Medications: She started  Vumerity February 2020: ? ?Family history: Her brother has active secondary progressive MS, diagnosed with MS 4-5 years ago but symptomatic for many more years.. ? ?Imaging studies: ? ?MRI 02/09/2021 showed  Scattered T2/FLAIR hyperintense foci in the hemispheres and also a focus in the spinal cord in a pattern consistent with chronic demyelinating plaque associated with multiple sclerosis.  None of the foci appear to be acute.  They do not enhance.  Compared to the MRI dated 01/13/2020, there are no new lesions. ? ?MRI of the brain 01/13/2020 shows T2/FLAIR hyperintense foci predominantly in the periventricular white matter of the hemispheres.  The pattern and configuration is consistent with chronic demyelinating plaque associated with multiple sclerosis.  None of the foci enhances or appears to be acute.  They were all present and appear unchanged on the MRI from 01/15/2019. ? ?MRI of the brain 01/15/2019 shows about a dozen T2/flair hyperintense foci in the hemispheres in the periventricular deep white matter.  These are consistent with chronic demyelinating plaque associated with multiple sclerosis. ? ?MRI of the cervical spine 06/12/2018 shows a nonenhancing T2 hyperintense focus in the posterior spinal cord adjacent to C2. This is consistent with a demyelinating plaque associated with multiple sclerosis.   There are mild multilevel degenerative changes as detailed above.  This causes mild spinal stenosis at C4-C5  and C5-C6.  There is no nerve root compression ? ?Pertinent labs: On 06/18/2018, hep B and hep C serology was negative.  QuantiFERON-TB was negative.  HIV was negative.  Varicella-zoster IgG is positive (2259).  Cytochrome P450 2C9 is *1/*3.   ? ?REVIEW OF SYSTEMS: ?Constitutional: No fevers, chills, sweats, or change in appetite ?Eyes: No visual changes, double vision, eye pain ?Ear, nose and throat: No hearing loss, ear pain, nasal congestion, sore throat ?Cardiovascular: No chest pain,  palpitations ?Respiratory:  No shortness of breath at rest or with exertion.   No wheezes ?GastrointestinaI: No nausea, vomiting, diarrhea, abdominal pain, fecal incontinence ?Genitourinary:  No dysuria, urinary retention or frequency.  No nocturia. ?Musculoskeletal:  No neck pain, back pain ?Integumentary: No rash, pruritus, skin lesions ?Neurological: as above ?Psychiatric: No depression at this time.  No anxiety ?Endocrine: No palpitations, diaphoresis, change in appetite, change in weigh or increased thirst ?Hematologic/Lymphatic:  No anemia, purpura, petechiae. ?Allergic/Immunologic: No itchy/runny eyes, nasal congestion, recent allergic reactions, rashes ? ?ALLERGIES: ?Allergies  ?Allergen Reactions  ? Clarithromycin Rash  ? Erythromycin Base Rash  ? Moxifloxacin Swelling and Anxiety  ?  lips swell  ? ? ?HOME MEDICATIONS: ? ?Current Outpatient Medications:  ?  Acetylcysteine (NAC PO), Take 1,000 mg by mouth daily., Disp: , Rfl:  ?  Ascorbic Acid (VITAMIN C PO), Take by mouth., Disp: , Rfl:  ?  B Complex Vitamins (VITAMIN-B COMPLEX) TABS, Take by mouth., Disp: , Rfl:  ?  cetirizine (ZYRTEC) 10 MG tablet, Take 10 mg by mouth daily., Disp: , Rfl:  ?  Cholecalciferol (VITAMIN D3) 1.25 MG (50000 UT) CAPS, TAKE 1 CAPSULE BY MOUTH ONE TIME PER WEEK, Disp: 12 capsule, Rfl: 0 ?  famotidine (PEPCID) 40 MG tablet, Take 1 tablet (40 mg total) by mouth daily before supper., Disp: 30 tablet, Rfl: 2 ?  ibuprofen (ADVIL) 200 MG tablet, Take 4 tablets (800 mg total) by mouth every 8 (eight) hours as needed. (Patient taking differently: Take 200 mg by mouth every 8 (eight) hours as needed.), Disp: 30 tablet, Rfl: 0 ?  Multiple Vitamins-Minerals (MULTIVITAMIN ADULT PO), Take by mouth., Disp: , Rfl:  ?  Omega-3 Fatty Acids (OMEGA 3 PO), Take by mouth., Disp: , Rfl:  ?  omeprazole (PRILOSEC) 40 MG capsule, Take 1 capsule (40 mg total) by mouth in the morning., Disp: 30 capsule, Rfl: 3 ?  TYRVAYA 0.03 MG/ACT SOLN, Place 1 spray  into the nose 2 (two) times daily., Disp: , Rfl:  ?  VUMERITY 231 MG CPDR, TAKE 2 CAPSULES BY MOUTH 2 TIMES A DAY., Disp: 120 capsule, Rfl: 11 ? ?PAST MEDICAL HISTORY: ?Past Medical History:  ?Diagnosis Date  ? Actinic dermatitis   ? sees Dr. Charlton Haws   ? Allergy   ? Endometriosis   ? Low back pain   ? Multiple sclerosis (HCC)   ? sees Dr. Despina Arias   ? Neuromuscular disorder (HCC)   ? has MS   ? Palpitations   ? Routine gynecological examination   ? sees Dr. Billy Coast   ? Seasonal allergies   ? ? ?PAST SURGICAL HISTORY: ?Past Surgical History:  ?Procedure Laterality Date  ? dysplasia    ? cervical ablation  ? KNEE ARTHROSCOPY    ? LAPAROSCOPY N/A 12/12/2013  ? Procedure: LAPAROSCOPY DIAGNOSTIC;  Surgeon: Meriel Pica, MD;  Location: [facility];  Service: Gynecology;  Laterality: N/A;  ? lumbar ESI    ? Dr Jeral Fruit  ? ? ?FAMILY HISTORY: ?Family  History  ?Problem Relation Age of Onset  ? Hyperthyroidism Mother   ?     Graves   ? Graves' disease Mother   ? Coronary artery disease Father   ? Cirrhosis Father   ? Multiple sclerosis Brother   ? ? ?SOCIAL HISTORY: ? ?Social History  ? ?Socioeconomic History  ? Marital status: Married  ?  Spouse name: Not on file  ? Number of children: 2  ? Years of education: BA  ? Highest education level: Not on file  ?Occupational History  ? Occupation: Works from home  ?Tobacco Use  ? Smoking status: Former  ? Smokeless tobacco: Never  ?Substance and Sexual Activity  ? Alcohol use: Yes  ?  Alcohol/week: 0.0 standard drinks  ?  Comment: Has 1 drink per day  ? Drug use: No  ? Sexual activity: Not on file  ?Other Topics Concern  ? Not on file  ?Social History Narrative  ? Lives with husband  ? Caffeine use: 1-2 cups per day  ? Left handed   ? ?Social Determinants of Health  ? ?Financial Resource Strain: Not on file  ?Food Insecurity: Not on file  ?Transportation Needs: Not on file  ?Physical Activity: Not on file  ?Stress: Not on file  ?Social Connections: Not on file  ?Intimate Partner  Violence: Not on file  ? ? ? ?PHYSICAL EXAM ? ?Vitals:  ? 08/09/21 0828  ?BP: 110/71  ?Pulse: 71  ?Weight: 117 lb (53.1 kg)  ?Height: 5\' 2"  (1.575 m)  ? ? ?Body mass index is 21.4 kg/m?. ? ? ?General: The patient is well-d

## 2021-08-10 LAB — CBC WITH DIFFERENTIAL/PLATELET
Basophils Absolute: 0.1 10*3/uL (ref 0.0–0.2)
Basos: 1 %
EOS (ABSOLUTE): 0.1 10*3/uL (ref 0.0–0.4)
Eos: 2 %
Hematocrit: 40.5 % (ref 34.0–46.6)
Hemoglobin: 13.3 g/dL (ref 11.1–15.9)
Immature Grans (Abs): 0 10*3/uL (ref 0.0–0.1)
Immature Granulocytes: 0 %
Lymphocytes Absolute: 1.6 10*3/uL (ref 0.7–3.1)
Lymphs: 31 %
MCH: 30.2 pg (ref 26.6–33.0)
MCHC: 32.8 g/dL (ref 31.5–35.7)
MCV: 92 fL (ref 79–97)
Monocytes Absolute: 0.4 10*3/uL (ref 0.1–0.9)
Monocytes: 8 %
Neutrophils Absolute: 3 10*3/uL (ref 1.4–7.0)
Neutrophils: 58 %
Platelets: 158 10*3/uL (ref 150–450)
RBC: 4.4 x10E6/uL (ref 3.77–5.28)
RDW: 13 % (ref 11.7–15.4)
WBC: 5.2 10*3/uL (ref 3.4–10.8)

## 2021-08-10 LAB — HEPATIC FUNCTION PANEL
ALT: 10 IU/L (ref 0–32)
AST: 16 IU/L (ref 0–40)
Albumin: 4.2 g/dL (ref 3.8–4.8)
Alkaline Phosphatase: 40 IU/L — ABNORMAL LOW (ref 44–121)
Bilirubin Total: 0.3 mg/dL (ref 0.0–1.2)
Bilirubin, Direct: <0.1 mg/dL (ref 0.00–0.40)
Total Protein: 6.2 g/dL (ref 6.0–8.5)

## 2021-09-01 ENCOUNTER — Other Ambulatory Visit: Payer: Self-pay | Admitting: Family Medicine

## 2021-11-26 ENCOUNTER — Other Ambulatory Visit: Payer: Self-pay | Admitting: Family Medicine

## 2021-12-01 ENCOUNTER — Other Ambulatory Visit: Payer: Self-pay | Admitting: Family Medicine

## 2021-12-01 MED ORDER — VITAMIN D3 1.25 MG (50000 UT) PO CAPS: ORAL_CAPSULE | ORAL | 0 refills | Status: DC

## 2021-12-29 ENCOUNTER — Other Ambulatory Visit: Payer: Self-pay | Admitting: Family Medicine

## 2022-01-10 ENCOUNTER — Ambulatory Visit (INDEPENDENT_AMBULATORY_CARE_PROVIDER_SITE_OTHER): Payer: Managed Care, Other (non HMO) | Admitting: Family Medicine

## 2022-01-10 ENCOUNTER — Encounter: Payer: Self-pay | Admitting: Family Medicine

## 2022-01-10 VITALS — BP 98/62 | HR 75 | Temp 97.4°F | Ht 62.5 in | Wt 121.0 lb

## 2022-01-10 DIAGNOSIS — Z0000 Encounter for general adult medical examination without abnormal findings: Secondary | ICD-10-CM

## 2022-01-10 LAB — CBC WITH DIFFERENTIAL/PLATELET
Basophils Absolute: 0.1 10*3/uL (ref 0.0–0.1)
Basophils Relative: 1.1 % (ref 0.0–3.0)
Eosinophils Absolute: 0.1 10*3/uL (ref 0.0–0.7)
Eosinophils Relative: 1.7 % (ref 0.0–5.0)
HCT: 37.8 % (ref 36.0–46.0)
Hemoglobin: 12.7 g/dL (ref 12.0–15.0)
Lymphocytes Relative: 24.5 % (ref 12.0–46.0)
Lymphs Abs: 1.4 10*3/uL (ref 0.7–4.0)
MCHC: 33.5 g/dL (ref 30.0–36.0)
MCV: 89.1 fl (ref 78.0–100.0)
Monocytes Absolute: 0.4 10*3/uL (ref 0.1–1.0)
Monocytes Relative: 7.3 % (ref 3.0–12.0)
Neutro Abs: 3.8 10*3/uL (ref 1.4–7.7)
Neutrophils Relative %: 65.4 % (ref 43.0–77.0)
Platelets: 170 10*3/uL (ref 150.0–400.0)
RBC: 4.25 Mil/uL (ref 3.87–5.11)
RDW: 13.3 % (ref 11.5–15.5)
WBC: 5.8 10*3/uL (ref 4.0–10.5)

## 2022-01-10 LAB — LIPID PANEL
Cholesterol: 147 mg/dL (ref 0–200)
HDL: 60.5 mg/dL (ref >39.00)
LDL Cholesterol: 77 mg/dL (ref 0–99)
NonHDL: 86.49
Total CHOL/HDL Ratio: 2
Triglycerides: 47 mg/dL (ref 0.0–149.0)
VLDL: 9.4 mg/dL (ref 0.0–40.0)

## 2022-01-10 LAB — BASIC METABOLIC PANEL
BUN: 16 mg/dL (ref 6–23)
CO2: 24 mEq/L (ref 19–32)
Calcium: 8.7 mg/dL (ref 8.4–10.5)
Chloride: 105 mEq/L (ref 96–112)
Creatinine, Ser: 0.66 mg/dL (ref 0.40–1.20)
GFR: 102.4 mL/min (ref >60.00)
Glucose, Bld: 84 mg/dL (ref 70–99)
Potassium: 3.9 mEq/L (ref 3.5–5.1)
Sodium: 137 mEq/L (ref 135–145)

## 2022-01-10 LAB — HEPATIC FUNCTION PANEL
ALT: 10 U/L (ref 0–35)
AST: 16 U/L (ref 0–37)
Albumin: 4.2 g/dL (ref 3.5–5.2)
Alkaline Phosphatase: 25 U/L — ABNORMAL LOW (ref 39–117)
Bilirubin, Direct: 0.1 mg/dL (ref 0.0–0.3)
Total Bilirubin: 0.4 mg/dL (ref 0.2–1.2)
Total Protein: 6.5 g/dL (ref 6.0–8.3)

## 2022-01-10 LAB — VITAMIN D 25 HYDROXY (VIT D DEFICIENCY, FRACTURES): VITD: 68.93 ng/mL (ref 30.00–100.00)

## 2022-01-10 LAB — TSH: TSH: 1.19 u[IU]/mL (ref 0.35–5.50)

## 2022-01-10 LAB — HEMOGLOBIN A1C: Hgb A1c MFr Bld: 5.3 % (ref 4.6–6.5)

## 2022-01-10 MED ORDER — VITAMIN D (ERGOCALCIFEROL) 1.25 MG (50000 UNIT) PO CAPS: 50000.0000 [IU] | ORAL_CAPSULE | ORAL | 3 refills | Status: AC

## 2022-01-10 NOTE — Progress Notes (Signed)
Subjective:    Patient ID: [name], female    DOB: 03-29-1971, 51 y.o.   MRN: [id number]  HPI Here for a well exam. She has a few issues she has been working with her GYN, Dr. Billy Coast, on including perimenopause and endometriosis. She has had some irregular menses and some hot flashes. She has also had a few episodes of vaginal bleeding after sex. He has done an [place] and a uterine biopsy which were normal. Otherwise she feels well. Her MS seems to be stable, and she has been on her current medication regimen for several years.    Review of Systems  Constitutional: Negative.   HENT: Negative.    Eyes: Negative.   Respiratory: Negative.    Cardiovascular: Negative.   Gastrointestinal: Negative.   Genitourinary:  Negative for decreased urine volume, difficulty urinating, dyspareunia, dysuria, enuresis, flank pain, frequency, hematuria, pelvic pain and urgency.  Musculoskeletal: Negative.   Skin: Negative.   Neurological: Negative.  Negative for headaches.  Psychiatric/Behavioral: Negative.         Objective:   Physical Exam Constitutional:      General: She is not in acute distress.    Appearance: Normal appearance. She is well-developed.  HENT:     Head: Normocephalic and atraumatic.     Right Ear: External ear normal.     Left Ear: External ear normal.     Nose: Nose normal.     Mouth/Throat:     Pharynx: No oropharyngeal exudate.  Eyes:     General: No scleral icterus.    Conjunctiva/sclera: Conjunctivae normal.     Pupils: Pupils are equal, round, and reactive to light.  Neck:     Thyroid: No thyromegaly.     Vascular: No JVD.  Cardiovascular:     Rate and Rhythm: Normal rate and regular rhythm.     Heart sounds: Normal heart sounds. No murmur heard.    No friction rub. No gallop.  Pulmonary:     Effort: Pulmonary effort is normal. No respiratory distress.     Breath sounds: Normal breath sounds. No wheezing or rales.  Chest:     Chest wall: No tenderness.   Abdominal:     General: Bowel sounds are normal. There is no distension.     Palpations: Abdomen is soft. There is no mass.     Tenderness: There is no abdominal tenderness. There is no guarding or rebound.  Musculoskeletal:        General: No tenderness. Normal range of motion.     Cervical back: Normal range of motion and neck supple.  Lymphadenopathy:     Cervical: No cervical adenopathy.  Skin:    General: Skin is warm and dry.     Findings: No erythema or rash.  Neurological:     Mental Status: She is alert and oriented to person, place, and time.     Cranial Nerves: No cranial nerve deficit.     Motor: No abnormal muscle tone.     Coordination: Coordination normal.     Deep Tendon Reflexes: Reflexes are normal and symmetric. Reflexes normal.  Psychiatric:        Behavior: Behavior normal.        Thought Content: Thought content normal.        Judgment: Judgment normal.           Assessment & Plan:  Well exam. We discussed diet and exercise. Get fasting labs. Refer for her first colonoscopy.  Gershon Crane,  MD

## 2022-01-20 ENCOUNTER — Encounter: Payer: Self-pay | Admitting: Family Medicine

## 2022-01-20 ENCOUNTER — Other Ambulatory Visit: Payer: Self-pay

## 2022-01-20 DIAGNOSIS — E559 Vitamin D deficiency, unspecified: Secondary | ICD-10-CM

## 2022-01-20 MED ORDER — VITAMIN D3 1.25 MG (50000 UT) PO CAPS: ORAL_CAPSULE | ORAL | 3 refills | Status: DC

## 2022-02-15 ENCOUNTER — Encounter: Payer: Self-pay | Admitting: Neurology

## 2022-02-15 ENCOUNTER — Ambulatory Visit: Payer: Managed Care, Other (non HMO) | Admitting: Neurology

## 2022-02-15 VITALS — BP 90/60 | HR 72 | Ht 62.0 in | Wt 121.8 lb

## 2022-02-15 DIAGNOSIS — G35 Multiple sclerosis: Secondary | ICD-10-CM | POA: Diagnosis not present

## 2022-02-15 DIAGNOSIS — R29818 Other symptoms and signs involving the nervous system: Secondary | ICD-10-CM | POA: Diagnosis not present

## 2022-02-15 DIAGNOSIS — R200 Anesthesia of skin: Secondary | ICD-10-CM | POA: Diagnosis not present

## 2022-02-15 DIAGNOSIS — G4700 Insomnia, unspecified: Secondary | ICD-10-CM

## 2022-02-15 DIAGNOSIS — Z79899 Other long term (current) drug therapy: Secondary | ICD-10-CM

## 2022-02-15 NOTE — Progress Notes (Signed)
[facility]  PATIENT: [name] DOB: 02-Jun-1970  REFERRING DOCTOR OR PCP:  Gershon Crane SOURCE: patient, notes from Dr. Clent Ridges  _________________________________   HISTORICAL  CHIEF COMPLAINT:  Chief Complaint  Patient presents with   Follow-up    Pt in rm #2 and alone. Pt states nothing has change since last visit.    HISTORY OF PRESENT ILLNESS:  [name] 51 y.o. woman diagnosed with relapsing remitting MS after her 06/10/2018 visit when she presented with numbness  Update 02/15/2022: She is on Vumerity and tolerates it well with only rare flushing.    Her MS is stable and she has no exacerbation.       MRI of the brain performed September 2022 showed no new MS lesions.    CBC/D showed normal WBC/lymhocytes 01/10/2022  She has no new neurologic symptoms.  Her gait and balance are doing well.   She occasionally has numbness in her feet and hands    She has no problems with balance and goes up and down stairs well without the banister.   She has no weakness.   She has some tingling, she notes it seems worse pre-menstrual.    ladder function is the same -- some urge incontinence especially before her period over the last few years is stable.   Vision is doing well.    She has some fatigue in the late mornings, especially if more active.  She denies depression.   Insomnia is better.   She denies signififcant cognitive issues - occasional reduced focus and attention.     She has had dry eyes (positive test at ophth) and dry mouth over the last year and uses eye drops.     SSA/SSB were negative though ANA was 1:320 with a nucleolar pattern.  She was placed on dros but stopped.  Eyes are still sometimes dry.    +++++++++++++++++++++++++++++++++++++++--  EPWORTH SLEEPINESS SCALE  On a scale of 0 - 3 what is the chance of dozing:  Sitting and Reading:   2 Watching TV:     1 Sitting inactive in a public place: 0 Passenger in car for one hour: 1 Lying down to  rest in the afternoon: 3 Sitting and talking to someone: 0 Sitting quietly after lunch:  1 In a car, stopped in traffic:  0   Total (out of 24):  8/24 normal EDS    MS history: She had the onset of numbness in both hands with a tingling sensation in mid November 2019.  She had a Lhermitte sign.  The tingling was uncomfortable at times but not painful.    She also had numbness in her right foot and ankle.  MRI of the cervical spine showed a nonenhancing focus adjacent to C1-C2 and MRI of the brain showed about a dozen T2 hyperintense foci consistent with MS.  Medications: She started Vumerity February 2020:  Family history: Her brother has active secondary progressive MS, diagnosed with MS 4-5 years ago but symptomatic for many more years..  Imaging studies:  MRI 02/09/2021 showed  Scattered T2/FLAIR hyperintense foci in the hemispheres and also a focus in the spinal cord in a pattern consistent with chronic demyelinating plaque associated with multiple sclerosis.  None of the foci appear to be acute.  They do not enhance.  Compared to the MRI dated 01/13/2020, there are no new lesions.  MRI of the brain 01/13/2020 shows T2/FLAIR hyperintense foci predominantly in the periventricular white matter of the hemispheres.  The  pattern and configuration is consistent with chronic demyelinating plaque associated with multiple sclerosis.  None of the foci enhances or appears to be acute.  They were all present and appear unchanged on the MRI from 01/15/2019.  MRI of the brain 01/15/2019 shows about a dozen T2/flair hyperintense foci in the hemispheres in the periventricular deep white matter.  These are consistent with chronic demyelinating plaque associated with multiple sclerosis.  MRI of the cervical spine 06/12/2018 shows a nonenhancing T2 hyperintense focus in the posterior spinal cord adjacent to C2. This is consistent with a demyelinating plaque associated with multiple sclerosis.   There are mild  multilevel degenerative changes as detailed above.  This causes mild spinal stenosis at C4-C5 and C5-C6.  There is no nerve root compression  Pertinent labs: On 06/18/2018, hep B and hep C serology was negative.  QuantiFERON-TB was negative.  HIV was negative.  Varicella-zoster IgG is positive (2259).  Cytochrome P450 2C9 is *1/*3.    REVIEW OF SYSTEMS: Constitutional: No fevers, chills, sweats, or change in appetite Eyes: No visual changes, double vision, eye pain Ear, nose and throat: No hearing loss, ear pain, nasal congestion, sore throat Cardiovascular: No chest pain, palpitations Respiratory:  No shortness of breath at rest or with exertion.   No wheezes GastrointestinaI: No nausea, vomiting, diarrhea, abdominal pain, fecal incontinence Genitourinary:  No dysuria, urinary retention or frequency.  No nocturia. Musculoskeletal:  No neck pain, back pain Integumentary: No rash, pruritus, skin lesions Neurological: as above Psychiatric: No depression at this time.  No anxiety Endocrine: No palpitations, diaphoresis, change in appetite, change in weigh or increased thirst Hematologic/Lymphatic:  No anemia, purpura, petechiae. Allergic/Immunologic: No itchy/runny eyes, nasal congestion, recent allergic reactions, rashes  ALLERGIES: Allergies  Allergen Reactions   Clarithromycin Rash   Erythromycin Base Rash   Moxifloxacin Swelling and Anxiety    lips swell    HOME MEDICATIONS:  Current Outpatient Medications:    Acetylcysteine (NAC PO), Take 1,000 mg by mouth daily., Disp: , Rfl:    Ascorbic Acid (VITAMIN C PO), Take by mouth., Disp: , Rfl:    B Complex Vitamins (VITAMIN-B COMPLEX) TABS, Take by mouth., Disp: , Rfl:    cetirizine (ZYRTEC) 10 MG tablet, Take 10 mg by mouth daily., Disp: , Rfl:    Cholecalciferol (VITAMIN D3) 1.25 MG (50000 UT) CAPS, Take 1 capsule by mouth One Time Per Week., Disp: 12 capsule, Rfl: 3   ibuprofen (ADVIL) 200 MG tablet, Take 4 tablets (800 mg total)  by mouth every 8 (eight) hours as needed. (Patient taking differently: Take 200 mg by mouth every 8 (eight) hours as needed.), Disp: 30 tablet, Rfl: 0   LO LOESTRIN FE 1 MG-10 MCG / 10 MCG tablet, Take 1 tablet by mouth daily., Disp: , Rfl:    Multiple Vitamins-Minerals (MULTIVITAMIN ADULT PO), Take by mouth., Disp: , Rfl:    Omega-3 Fatty Acids (OMEGA 3 PO), Take by mouth., Disp: , Rfl:    TYRVAYA 0.03 MG/ACT SOLN, Place 1 spray into the nose 2 (two) times daily., Disp: , Rfl:    Vitamin D, Ergocalciferol, (DRISDOL) 1.25 MG (50000 UNIT) CAPS capsule, Take 1 capsule (50,000 Units total) by mouth every 7 (seven) days., Disp: 12 capsule, Rfl: 3   VUMERITY 231 MG CPDR, TAKE 2 CAPSULES BY MOUTH 2 TIMES A DAY., Disp: 120 capsule, Rfl: 11  PAST MEDICAL HISTORY: Past Medical History:  Diagnosis Date   Actinic dermatitis    sees Dr. Rozann Lesches  Allergy    Endometriosis    Low back pain    Multiple sclerosis (HCC)    sees Dr. Despina Arias    Neuromuscular disorder [facility])    has MS    Palpitations    Routine gynecological examination    sees Dr. Billy Coast    Seasonal allergies     PAST SURGICAL HISTORY: Past Surgical History:  Procedure Laterality Date   dysplasia     cervical ablation   KNEE ARTHROSCOPY     LAPAROSCOPY N/A 12/12/2013   Procedure: LAPAROSCOPY DIAGNOSTIC;  Surgeon: Meriel Pica, MD;  Location: [facility];  Service: Gynecology;  Laterality: N/A;   lumbar ESI     Dr Jeral Fruit    FAMILY HISTORY: Family History  Problem Relation Age of Onset   Hyperthyroidism Mother        Christian Mate' disease Mother    Coronary artery disease Father    Cirrhosis Father    Multiple sclerosis Brother     SOCIAL HISTORY:  Social History   Socioeconomic History   Marital status: Married    Spouse name: Not on file   Number of children: 2   Years of education: BA   Highest education level: Not on file  Occupational History   Occupation: Works from home  Tobacco Use    Smoking status: Former   Smokeless tobacco: Never  Substance and Sexual Activity   Alcohol use: Yes    Alcohol/week: 0.0 standard drinks of alcohol    Comment: Has 1 drink per day   Drug use: No   Sexual activity: Not on file  Other Topics Concern   Not on file  Social History Narrative   Lives with husband   Caffeine use: 1-2 cups per day   Left handed    Social Determinants of Health   Financial Resource Strain: Not on file  Food Insecurity: Not on file  Transportation Needs: Not on file  Physical Activity: Not on file  Stress: Not on file  Social Connections: Not on file  Intimate Partner Violence: Not on file     PHYSICAL EXAM  Vitals:   02/15/22 0813  BP: 90/60  Pulse: 72  Weight: 121 lb 12.8 oz (55.2 kg)  Height:  (1.575 m)    Body mass index is 22.28 kg/m.   General: The patient is well-developed and well-nourished and in no acute distress.      Neurologic Exam  Mental status: The patient is alert and oriented x 3 at the time of the examination. The patient has apparent normal recent and remote memory, with an apparently normal attention span and concentration ability.   Speech is normal.  Cranial nerves: Eye movements are normal.  Facial strength is normal..   No obvious hearing deficits are noted.  Motor:  Muscle bulk is normal.   Tone is normal. Strength is  5 / 5 in all 4 extremities.   Sensory: She no longer has a Lhermitte sign.  She had normal sensation.  Coordination: Cerebellar testing shows good finger-nose-finger and heel-to-shin bilaterally.  Gait and station: Station is normal.  Gait is normal.  Tandem gait is normal.   Romberg is negative.  Reflexes: Deep tendon reflexes are symmetric and normal, to the arms and 3 in the legs bilaterally.Marland Kitchen       DIAGNOSTIC DATA (LABS, IMAGING, TESTING) - I reviewed patient records, labs, notes, testing and imaging myself where available.  Lab Results  Component Value Date  WBC 5.8  01/10/2022   HGB 12.7 01/10/2022   HCT 37.8 01/10/2022   MCV 89.1 01/10/2022   PLT 170.0 01/10/2022      Component Value Date/Time   NA 137 01/10/2022 1108   NA 139 06/18/2018 1117   K 3.9 01/10/2022 1108   CL 105 01/10/2022 1108   CO2 24 01/10/2022 1108   GLUCOSE 84 01/10/2022 1108   BUN 16 01/10/2022 1108   BUN 11 06/18/2018 1117   CREATININE 0.66 01/10/2022 1108   CALCIUM 8.7 01/10/2022 1108   PROT 6.5 01/10/2022 1108   PROT 6.2 08/09/2021 0912   ALBUMIN 4.2 01/10/2022 1108   ALBUMIN 4.2 08/09/2021 0912   AST 16 01/10/2022 1108   ALT 10 01/10/2022 1108   ALKPHOS 25 (L) 01/10/2022 1108   BILITOT 0.4 01/10/2022 1108   BILITOT 0.3 08/09/2021 0912   GFRNONAA 93 06/18/2018 1117   GFRAA 107 06/18/2018 1117   Lab Results  Component Value Date   CHOL 147 01/10/2022   HDL 60.50 01/10/2022   LDLCALC 77 01/10/2022   TRIG 47.0 01/10/2022   CHOLHDL 2 01/10/2022   Lab Results  Component Value Date   HGBA1C 5.3 01/10/2022   Lab Results  Component Value Date   VITAMINB12 336 05/02/2018   Lab Results  Component Value Date   TSH 1.19 01/10/2022       ASSESSMENT AND PLAN    1. Multiple sclerosis (HCC)   2. Numbness   3. High risk medication use   4. Lhermitte's sign positive   5. Insomnia, unspecified type       1.  She will continue Vumerity.  CBC with differential was fine last month.  We will check MRI of the brain and thoracic spine to determine if there is any subclinical progression and to further evaluate some of the sensory symptoms.   2.  Continue vitamin D.  She asked about hormone replacement therapy.  There is no contraindication from an MS standpoint. 3.   Stay active and exercise as possible 4.   Continue to exercise and eat a heart healthy diet  5.   rtc 6 months or sooner if new or worsening neurologic issues.   Kenden Brandt A. Epimenio Foot, MD, PhD, FAAN Certified in Neurology, Clinical Neurophysiology, Sleep Medicine, Pain Medicine and  Neuroimaging Director, [facility]  [facility] [address], [address] [place], [place] [address] [phone]

## 2022-02-16 ENCOUNTER — Telehealth: Payer: Self-pay | Admitting: Neurology

## 2022-02-16 NOTE — Telephone Encounter (Signed)
[employer] sent to GI they obtain auth  

## 2022-02-27 ENCOUNTER — Encounter: Payer: Self-pay | Admitting: Family Medicine

## 2022-02-27 DIAGNOSIS — Z0000 Encounter for general adult medical examination without abnormal findings: Secondary | ICD-10-CM

## 2022-02-28 NOTE — Telephone Encounter (Signed)
I did the referral 

## 2022-03-07 ENCOUNTER — Ambulatory Visit
Admission: RE | Admit: 2022-03-07 | Discharge: 2022-03-07 | Disposition: A | Discharge status: Home / Self Care | Payer: Managed Care, Other (non HMO) | Source: Ambulatory Visit | Attending: Neurology | Admitting: Neurology

## 2022-03-07 DIAGNOSIS — G35 Multiple sclerosis: Secondary | ICD-10-CM | POA: Diagnosis not present

## 2022-03-07 DIAGNOSIS — R200 Anesthesia of skin: Secondary | ICD-10-CM

## 2022-03-07 MED ORDER — GADOBENATE DIMEGLUMINE 529 MG/ML IV SOLN
11.0000 mL | Freq: Once | INTRAVENOUS | Status: AC | PRN
  Administered 2022-03-07: 11 mL via INTRAVENOUS

## 2022-03-28 ENCOUNTER — Encounter: Payer: Managed Care, Other (non HMO) | Admitting: Family Medicine

## 2022-04-18 ENCOUNTER — Telehealth: Payer: Self-pay | Admitting: *Deleted

## 2022-04-18 NOTE — Telephone Encounter (Signed)
Faxed completed/signed PA Vumerity to [facility] at [phone]. Received fax confirmation, waiting on determination.

## 2022-04-19 NOTE — Telephone Encounter (Signed)
Received fax from [facility] that PA approved 04/18/22-04/19/23. PA# [employer] [employer] [phone]

## 2022-05-16 ENCOUNTER — Other Ambulatory Visit: Payer: Self-pay

## 2022-05-16 MED ORDER — VUMERITY 231 MG PO CPDR: DELAYED_RELEASE_CAPSULE | ORAL | 11 refills | Status: DC

## 2022-06-21 LAB — HM MAMMOGRAPHY

## 2022-08-16 ENCOUNTER — Encounter: Payer: Self-pay | Admitting: Neurology

## 2022-08-16 ENCOUNTER — Ambulatory Visit: Payer: Managed Care, Other (non HMO) | Admitting: Neurology

## 2022-08-16 VITALS — BP 112/71 | HR 75 | Ht 62.0 in | Wt 125.5 lb

## 2022-08-16 DIAGNOSIS — Z79899 Other long term (current) drug therapy: Secondary | ICD-10-CM

## 2022-08-16 DIAGNOSIS — G35 Multiple sclerosis: Secondary | ICD-10-CM

## 2022-08-16 DIAGNOSIS — R200 Anesthesia of skin: Secondary | ICD-10-CM | POA: Diagnosis not present

## 2022-08-16 DIAGNOSIS — E559 Vitamin D deficiency, unspecified: Secondary | ICD-10-CM | POA: Diagnosis not present

## 2022-08-16 NOTE — Progress Notes (Signed)
[facility]  PATIENT: [name] DOB: November 17, 1970  REFERRING DOCTOR OR PCP:  Alysia Penna SOURCE: patient, notes from Dr. Sarajane Jews  _________________________________   HISTORICAL  CHIEF COMPLAINT:  Chief Complaint  Patient presents with   Room 10    Pt is here Alone. Pt states that everything is going good with her MS. No new things to report.     HISTORY OF PRESENT ILLNESS:  [name] 52 y.o. woman diagnosed with relapsing remitting MS after her 06/10/2018 visit when she presented with numbness  Update 08/16/2022 She is on Vumerity and tolerates it well with only rare flushing.    Her MS is stable and she has no exacerbation.       MRI of the brain performed September 2022 showed no new MS lesions.    CBC/D showed normal WBC/lymhocytes of 1.4 01/10/2022  She has no major new neurologic symptoms.  Te right leg is sometimes tingly.   Her gait and balance are doing well.      She has no problems with balance and goes up and down stairs well without the banister.   She has no weakness.   She has some tingling, she notes it seems worse pre-menstrual.    Bladder function is the same -- rare urge incontinence.     Vision is doing well.  She uses readers some.   She has some fatigue in the late mornings, especially if more active.  She denies depression.   Insomnia is variable.  If she sleeps poorly at night she may take a nap. .   She denies signififcant cognitive issues - occasional reduced focus and attention.     She has had dry eyes (positive test at ophth) and dry mouth over the last year and uses eye drops.     SSA/SSB were negative though ANA was 1:320 with a nucleolar pattern.  She was placed on dros but stopped.  Eyes are still sometimes dry.      She takes a vit D supplement and 12/2021 level was good at 68.9   MS history: She had the onset of numbness in both hands with a tingling sensation in mid November 2019.  She had a Lhermitte sign.  The tingling  was uncomfortable at times but not painful.    She also had numbness in her right foot and ankle.  MRI of the cervical spine showed a nonenhancing focus adjacent to C1-C2 and MRI of the brain showed about a dozen T2 hyperintense foci consistent with MS.  Medications: She started Vumerity February 2020:  Family history: Her brother has active secondary progressive MS, diagnosed with MS around 16 but symptomatic for many more years..   Imaging studies: MRI of the brain 03/07/2022 showed no new lesions.  Normal enhancement pattern.  No change compared to 02/09/2021.  MRI of the thoracic spine 03/07/2022 showed a normal spinal cord.  Some degenerative changes were noted in the lower thoracic spine.  MRI 02/09/2021 showed  Scattered T2/FLAIR hyperintense foci in the hemispheres and also a focus in the spinal cord in a pattern consistent with chronic demyelinating plaque associated with multiple sclerosis.  None of the foci appear to be acute.  They do not enhance.  Compared to the MRI dated 01/13/2020, there are no new lesions.  MRI of the brain 01/13/2020 shows T2/FLAIR hyperintense foci predominantly in the periventricular white matter of the hemispheres.  The pattern and configuration is consistent with chronic demyelinating plaque associated with multiple  sclerosis.  None of the foci enhances or appears to be acute.  They were all present and appear unchanged on the MRI from 01/15/2019.  MRI of the brain 01/15/2019 shows about a dozen T2/flair hyperintense foci in the hemispheres in the periventricular deep white matter.  These are consistent with chronic demyelinating plaque associated with multiple sclerosis.  MRI of the cervical spine 06/12/2018 shows a nonenhancing T2 hyperintense focus in the posterior spinal cord adjacent to C2. This is consistent with a demyelinating plaque associated with multiple sclerosis.   There are mild multilevel degenerative changes as detailed above.  This causes mild  spinal stenosis at C4-C5 and C5-C6.  There is no nerve root compression  Pertinent labs: On 06/18/2018, hep B and hep C serology was negative.  QuantiFERON-TB was negative.  HIV was negative.  Varicella-zoster IgG is positive (2259).  Cytochrome P450 2C9 is *1/*3.    REVIEW OF SYSTEMS: Constitutional: No fevers, chills, sweats, or change in appetite Eyes: No visual changes, double vision, eye pain Ear, nose and throat: No hearing loss, ear pain, nasal congestion, sore throat Cardiovascular: No chest pain, palpitations Respiratory:  No shortness of breath at rest or with exertion.   No wheezes GastrointestinaI: No nausea, vomiting, diarrhea, abdominal pain, fecal incontinence Genitourinary:  No dysuria, urinary retention or frequency.  No nocturia. Musculoskeletal:  No neck pain, back pain Integumentary: No rash, pruritus, skin lesions Neurological: as above Psychiatric: No depression at this time.  No anxiety Endocrine: No palpitations, diaphoresis, change in appetite, change in weigh or increased thirst Hematologic/Lymphatic:  No anemia, purpura, petechiae. Allergic/Immunologic: No itchy/runny eyes, nasal congestion, recent allergic reactions, rashes  ALLERGIES: Allergies  Allergen Reactions   Clarithromycin Rash   Erythromycin Base Rash   Moxifloxacin Swelling and Anxiety    lips swell    HOME MEDICATIONS:  Current Outpatient Medications:    Acetylcysteine (NAC PO), Take 1,000 mg by mouth daily., Disp: , Rfl:    Ascorbic Acid (VITAMIN C PO), Take by mouth., Disp: , Rfl:    B Complex Vitamins (VITAMIN-B COMPLEX) TABS, Take by mouth., Disp: , Rfl:    cetirizine (ZYRTEC) 10 MG tablet, Take 10 mg by mouth daily., Disp: , Rfl:    Cholecalciferol (VITAMIN D3) 1.25 MG (50000 UT) CAPS, Take 1 capsule by mouth One Time Per Week., Disp: 12 capsule, Rfl: 3   ibuprofen (ADVIL) 200 MG tablet, Take 4 tablets (800 mg total) by mouth every 8 (eight) hours as needed. (Patient taking differently:  Take 200 mg by mouth every 8 (eight) hours as needed.), Disp: 30 tablet, Rfl: 0   LO LOESTRIN FE 1 MG-10 MCG / 10 MCG tablet, Take 1 tablet by mouth daily., Disp: , Rfl:    Multiple Vitamins-Minerals (MULTIVITAMIN ADULT PO), Take by mouth., Disp: , Rfl:    Omega-3 Fatty Acids (OMEGA 3 PO), Take by mouth., Disp: , Rfl:    TYRVAYA 0.03 MG/ACT SOLN, Place 1 spray into the nose 2 (two) times daily., Disp: , Rfl:    Vitamin D, Ergocalciferol, (DRISDOL) 1.25 MG (50000 UNIT) CAPS capsule, Take 1 capsule (50,000 Units total) by mouth every 7 (seven) days., Disp: 12 capsule, Rfl: 3   VUMERITY 231 MG CPDR, TAKE 2 CAPSULES BY MOUTH 2 TIMES A DAY., Disp: 120 capsule, Rfl: 11  PAST MEDICAL HISTORY: Past Medical History:  Diagnosis Date   Actinic dermatitis    sees Dr. Rozann Lesches    Allergy    Endometriosis    Low back pain  Multiple sclerosis ([place])    sees Dr. Arlice Colt    Neuromuscular disorder [facility])    has MS    Palpitations    Routine gynecological examination    sees Dr. Ronita Hipps    Seasonal allergies     PAST SURGICAL HISTORY: Past Surgical History:  Procedure Laterality Date   dysplasia     cervical ablation   KNEE ARTHROSCOPY     LAPAROSCOPY N/A 12/12/2013   Procedure: LAPAROSCOPY DIAGNOSTIC;  Surgeon: Margarette Asal, MD;  Location: [facility];  Service: Gynecology;  Laterality: N/A;   lumbar ESI     Dr Joya Salm    FAMILY HISTORY: Family History  Problem Relation Age of Onset   Hyperthyroidism Mother        Gwyndolyn Kaufman' disease Mother    Coronary artery disease Father    Cirrhosis Father    Multiple sclerosis Brother     SOCIAL HISTORY:  Social History   Socioeconomic History   Marital status: Married    Spouse name: Not on file   Number of children: 2   Years of education: BA   Highest education level: Not on file  Occupational History   Occupation: Works from home  Tobacco Use   Smoking status: Former   Smokeless tobacco: Never  Substance and Sexual  Activity   Alcohol use: Yes    Alcohol/week: 0.0 standard drinks of alcohol    Comment: Has 1 drink per day   Drug use: No   Sexual activity: Not on file  Other Topics Concern   Not on file  Social History Narrative   Lives with husband   Caffeine use: 1-2 cups per day   Left handed    Social Determinants of Health   Financial Resource Strain: Not on file  Food Insecurity: Not on file  Transportation Needs: Not on file  Physical Activity: Not on file  Stress: Not on file  Social Connections: Not on file  Intimate Partner Violence: Not on file     PHYSICAL EXAM  Vitals:   08/16/22 0827  BP: 112/71  Pulse: 75  Weight: 125 lb 8 oz (56.9 kg)  Height: 5\' 2"  (1.575 m)    Body mass index is 22.95 kg/m.   General: The patient is well-developed and well-nourished and in no acute distress.      Neurologic Exam  Mental status: The patient is alert and oriented x 3 at the time of the examination. The patient has apparent normal recent and remote memory, with an apparently normal attention span and concentration ability.   Speech is normal.  Cranial nerves: Eye movements are normal.  Facial strength is normal..   No obvious hearing deficits are noted.  Motor:  Muscle bulk is normal.   Tone is normal. Strength is  5 / 5 in all 4 extremities.   Sensory: Nohermitte sign.  Sensation was normal to vibration and touch in the legs.  Coordination: Cerebellar testing shows good finger-nose-finger and heel-to-shin bilaterally.  Gait and station: Station is normal.  Gait is normal.  Tandem gait is normal.   Romberg is negative.  Reflexes: Deep tendon reflexes are symmetric and normal, to the arms and 3 in the legs bilaterally.Marland Kitchen       DIAGNOSTIC DATA (LABS, IMAGING, TESTING) - I reviewed patient records, labs, notes, testing and imaging myself where available.  Lab Results  Component Value Date   WBC 5.8 01/10/2022   HGB 12.7 01/10/2022   HCT 37.8  01/10/2022   MCV 89.1  01/10/2022   PLT 170.0 01/10/2022      Component Value Date/Time   NA 137 01/10/2022 1108   NA 139 06/18/2018 1117   K 3.9 01/10/2022 1108   CL 105 01/10/2022 1108   CO2 24 01/10/2022 1108   GLUCOSE 84 01/10/2022 1108   BUN 16 01/10/2022 1108   BUN 11 06/18/2018 1117   CREATININE 0.66 01/10/2022 1108   CALCIUM 8.7 01/10/2022 1108   PROT 6.5 01/10/2022 1108   PROT 6.2 08/09/2021 0912   ALBUMIN 4.2 01/10/2022 1108   ALBUMIN 4.2 08/09/2021 0912   AST 16 01/10/2022 1108   ALT 10 01/10/2022 1108   ALKPHOS 25 (L) 01/10/2022 1108   BILITOT 0.4 01/10/2022 1108   BILITOT 0.3 08/09/2021 0912   GFRNONAA 93 06/18/2018 1117   GFRAA 107 06/18/2018 1117   Lab Results  Component Value Date   CHOL 147 01/10/2022   HDL 60.50 01/10/2022   LDLCALC 77 01/10/2022   TRIG 47.0 01/10/2022   CHOLHDL 2 01/10/2022   Lab Results  Component Value Date   HGBA1C 5.3 01/10/2022   Lab Results  Component Value Date   VITAMINB12 336 05/02/2018   Lab Results  Component Value Date   TSH 1.19 01/10/2022       ASSESSMENT AND PLAN    1. Multiple sclerosis ([place])   2. High risk medication use   3. Numbness   4. Vitamin D deficiency        1.  Her MS is stable.  She will continue Vumerity.  We will check a CBC with differential today.   2.  Continue vitamin D.  She asked about hormone replacement therapy.  There is no contraindication from an MS standpoint. 3.   Stay active and exercise as possible 4.   Continue to exercise and eat a heart healthy diet  5.   rtc 6 months or sooner if new or worsening neurologic issues.   Talena Neira A. Felecia Shelling, MD, PhD, FAAN Certified in Neurology, Clinical Neurophysiology, Sleep Medicine, Pain Medicine and Neuroimaging Director, [place] [facility] [address], [employer] [place], [place] [address] [phone]

## 2022-08-17 LAB — CBC WITH DIFFERENTIAL/PLATELET
Basophils Absolute: 0.1 10*3/uL (ref 0.0–0.2)
Basos: 1 %
EOS (ABSOLUTE): 0.1 10*3/uL (ref 0.0–0.4)
Eos: 2 %
Hematocrit: 44 % (ref 34.0–46.6)
Hemoglobin: 14.8 g/dL (ref 11.1–15.9)
Immature Grans (Abs): 0 10*3/uL (ref 0.0–0.1)
Immature Granulocytes: 0 %
Lymphocytes Absolute: 2 10*3/uL (ref 0.7–3.1)
Lymphs: 35 %
MCH: 31.7 pg (ref 26.6–33.0)
MCHC: 33.6 g/dL (ref 31.5–35.7)
MCV: 94 fL (ref 79–97)
Monocytes Absolute: 0.5 10*3/uL (ref 0.1–0.9)
Monocytes: 8 %
Neutrophils Absolute: 3.2 10*3/uL (ref 1.4–7.0)
Neutrophils: 54 %
Platelets: 161 10*3/uL (ref 150–450)
RBC: 4.67 x10E6/uL (ref 3.77–5.28)
RDW: 12.8 % (ref 11.7–15.4)
WBC: 5.8 10*3/uL (ref 3.4–10.8)

## 2022-10-09 ENCOUNTER — Ambulatory Visit: Payer: Managed Care, Other (non HMO) | Admitting: Family Medicine

## 2022-10-09 ENCOUNTER — Encounter: Payer: Self-pay | Admitting: Family Medicine

## 2022-10-09 VITALS — BP 110/70 | HR 82 | Temp 98.5°F | Resp 12 | Ht 62.0 in | Wt 119.5 lb

## 2022-10-09 DIAGNOSIS — K219 Gastro-esophageal reflux disease without esophagitis: Secondary | ICD-10-CM | POA: Diagnosis not present

## 2022-10-09 DIAGNOSIS — R112 Nausea with vomiting, unspecified: Secondary | ICD-10-CM | POA: Diagnosis not present

## 2022-10-09 DIAGNOSIS — K529 Noninfective gastroenteritis and colitis, unspecified: Secondary | ICD-10-CM | POA: Diagnosis not present

## 2022-10-09 MED ORDER — ONDANSETRON HCL 4 MG PO TABS: 4.0000 mg | ORAL_TABLET | Freq: Three times a day (TID) | ORAL | 0 refills | Status: AC | PRN

## 2022-10-09 MED ORDER — OMEPRAZOLE 20 MG PO CPDR
20.0000 mg | DELAYED_RELEASE_CAPSULE | Freq: Every day | ORAL | 0 refills | Status: AC
Start: 2022-10-09 — End: 2024-01-24

## 2022-10-09 NOTE — Progress Notes (Signed)
ACUTE VISIT Chief Complaint  Patient presents with   food poisoning    Possible food poisoning, started on Friday. Vomited x 1 on Friday. Diarrhea started yesterday. Having some chest discomfort.    HPI: [name] is a 52 y.o. female with PMHx significant for MS, vit D def, GERD, and back pain here today reporting symptoms of diarrhea, nausea, and vomiting, which she attributes to food poisoning. 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  She describes having about five episodes of diarrhea yesterday and one episode of vomiting 3 days ago.  Diarrhea  This is a new problem. The current episode started yesterday. The problem has been gradually improving. The patient states that diarrhea does not awaken her from sleep. Associated symptoms include abdominal pain, chills, headaches and myalgias. Pertinent negatives include no arthralgias, bloating, coughing, fever or increased  flatus. Nothing aggravates the symptoms. Risk factors include ill contacts. She has tried increased fluids for the symptoms. There is no history of bowel resection or inflammatory bowel disease.   She has been managing her hydration by drinking water and taking electrolyte packets. She ate a lettuce wrap before symptoms started, somebody else did with no problem. He denies recent travel or antibiotic treatment within the past 3 months. Headache is also better today.  Additionally, she experiences pain periumbilical cramps, epigastric abdominal pain, and lower mid chest burning like sensation. She also notes increased burping. She has had some chills, body aches, and fatigue, though she reports no fever. Decreased appetite, which she has noted that is improving.  GERD on no pharmacologic treatment.  She confirms that there have been no changes in her medication recently.  Review of Systems  Constitutional:  Positive for chills. Negative for fever.  HENT:   Negative for mouth sores, postnasal drip, rhinorrhea and sore throat.   Respiratory:  Negative for cough.   Gastrointestinal:  Positive for abdominal pain, diarrhea and nausea. Negative for bloating and flatus.  Endocrine: Negative for cold intolerance and heat intolerance.  Genitourinary:  Negative for decreased urine volume, dysuria and hematuria.  Musculoskeletal:  Positive for myalgias. Negative for arthralgias.  Skin:  Negative for rash.  Allergic/Immunologic: Negative for food allergies.  Neurological:  Positive for headaches. Negative for syncope and facial asymmetry.  See other pertinent positives and negatives in HPI.  Current Outpatient Medications on File Prior to Visit  Medication Sig Dispense Refill   Acetylcysteine (NAC PO) Take 1,000 mg by mouth daily.     Ascorbic Acid (VITAMIN C PO) Take by mouth.     B Complex Vitamins (VITAMIN-B COMPLEX) TABS Take by mouth.     cetirizine (ZYRTEC) 10 MG tablet Take 10 mg by mouth daily.     Cholecalciferol (VITAMIN D3) 1.25 MG (50000 UT) CAPS Take 1 capsule by mouth One Time Per Week. 12 capsule 3   ibuprofen (ADVIL) 200 MG tablet Take 4 tablets (800 mg total) by mouth every 8 (eight) hours as needed. (Patient taking differently: Take 200 mg by mouth every 8 (eight) hours as needed.) 30 tablet 0   LO LOESTRIN FE 1 MG-10 MCG / 10 MCG tablet Take 1 tablet by mouth daily.     Multiple Vitamins-Minerals (MULTIVITAMIN ADULT PO) Take by mouth.     Omega-3 Fatty Acids (OMEGA 3 PO) Take by mouth.     TYRVAYA 0.03 MG/ACT SOLN Place 1 spray into the nose 2 (two) times daily.     Vitamin D, Ergocalciferol, (DRISDOL)  1.25 MG (50000 UNIT) CAPS capsule Take 1 capsule (50,000 Units total) by mouth every 7 (seven) days. 12 capsule 3   VUMERITY 231 MG CPDR TAKE 2 CAPSULES BY MOUTH 2 TIMES A DAY. 120 capsule 11   No current facility-administered medications on file prior to visit.   Past Medical History:  Diagnosis Date   Actinic dermatitis    sees  Dr. Charlton Haws    Allergy    Endometriosis    Low back pain    Multiple sclerosis [facility])    sees Dr. Despina Arias    Neuromuscular disorder [facility])    has MS    Palpitations    Routine gynecological examination    sees Dr. Billy Coast    Seasonal allergies    Allergies  Allergen Reactions   Clarithromycin Rash   Erythromycin Base Rash   Moxifloxacin Swelling and Anxiety    lips swell   Social History   Socioeconomic History   Marital status: Married    Spouse name: Not on file   Number of children: 2   Years of education: BA   Highest education level: Not on file  Occupational History   Occupation: Works from home  Tobacco Use   Smoking status: Former   Smokeless tobacco: Never  Substance and Sexual Activity   Alcohol use: Yes    Alcohol/week: 0.0 standard drinks of alcohol    Comment: Has 1 drink per day   Drug use: No   Sexual activity: Not on file  Other Topics Concern   Not on file  Social History Narrative   Lives with husband   Caffeine use: 1-2 cups per day   Left handed    Social Determinants of Health   Financial Resource Strain: Not on file  Food Insecurity: Not on file  Transportation Needs: Not on file  Physical Activity: Not on file  Stress: Not on file  Social Connections: Not on file   Vitals:   10/09/22 0917  BP: 110/70  Pulse: 82  Resp: 12  Temp: 98.5 F (36.9 C)  SpO2: 99%   Body mass index is 21.86 kg/m.  Physical Exam Vitals and nursing note reviewed.  Constitutional:      General: She is not in acute distress.    Appearance: She is well-developed.  HENT:     Head: Normocephalic and atraumatic.     Mouth/Throat:     Mouth: Mucous membranes are moist.     Pharynx: Oropharynx is clear.  Eyes:     Conjunctiva/sclera: Conjunctivae normal.  Cardiovascular:     Rate and Rhythm: Normal rate and regular rhythm.     Heart sounds: No murmur heard. Pulmonary:     Effort: Pulmonary effort is normal. No respiratory distress.      Breath sounds: Normal breath sounds.  Abdominal:     General: Bowel sounds are normal.     Palpations: Abdomen is soft. There is no hepatomegaly or mass.     Tenderness: There is abdominal tenderness (Mild "discomfort") in the epigastric area.  Musculoskeletal:     Right lower leg: No edema.     Left lower leg: No edema.  Lymphadenopathy:     Cervical: No cervical adenopathy.  Skin:    General: Skin is warm.     Findings: No erythema or rash.  Neurological:     General: No focal deficit present.     Mental Status: She is alert and oriented to person, place, and time.  Gait: Gait normal.  Psychiatric:        Mood and Affect: Mood and affect normal.   ASSESSMENT AND PLAN: [name] was seen today for diarrhea.  Gastroenteritis, acute Most likely viral etiology, so symptomatic treatment recommended for now. Other possible causes discussed but at this time I do not think further work-up is necessary. Symptoms seem to be improving. OTC Imodium could be used but I do not recommend unless 6 or more stools daily. Oral hydration to continue. Bland and light diet if tolerated. Clearly instructed about warning signs. If diarrhea is persistent for over a week, she will let me know, so we can arrange stool analysis. F/U as needed.  Nausea and vomiting in adult Vomiting has resolved. Advance oral intake as tolerated. Zofran 4 mg 3 times daily as needed.  -     Ondansetron HCl; Take 1 tablet (4 mg total) by mouth every 8 (eight) hours as needed for up to 3 days for nausea or vomiting.  Dispense: 9 tablet; Refill: 0  Gastroesophageal reflux disease, unspecified whether esophagitis present Epigastric burning sensation and chest discomfort could be related to this problem. History and examination do not suggest a serious process. Recommend omeprazole 20 mg 30 minutes before breakfast for 4 to 6 weeks. Continue GERD precautions.  -     Omeprazole; Take 1 capsule (20 mg total) by  mouth daily.  Dispense: 45 capsule; Refill: 0  Return if symptoms worsen or fail to improve.  Ardene Remley G. [place], MD  [facility]. [place] office.

## 2022-10-09 NOTE — Patient Instructions (Addendum)
A few things to remember from today's visit:  Gastroenteritis, acute  Nausea and vomiting in adult - Plan: ondansetron (ZOFRAN) 4 MG tablet  Gastroesophageal reflux disease, unspecified whether esophagitis present - Plan: omeprazole (PRILOSEC) 20 MG capsule  If diarrhea persists for over a week, we may need to send stool for cultures. Omeprazole 20 mg 30 min before breakfast for 4-6 weeks.  Do not use My Chart to request refills or for acute issues that need immediate attention. If you send a my chart message, it may take a few days to be addressed, specially if I am not in the office.  Please be sure medication list is accurate. If a new problem present, please set up appointment sooner than planned today.

## 2022-11-25 ENCOUNTER — Other Ambulatory Visit: Payer: Self-pay | Admitting: Family Medicine

## 2022-11-25 DIAGNOSIS — E559 Vitamin D deficiency, unspecified: Secondary | ICD-10-CM

## 2022-11-28 NOTE — Telephone Encounter (Signed)
Pt LOV was on 10/09/22 Last Vit D lab was done on 01/10/22 Last refill was done on 01/10/22 Please advise

## 2023-02-12 LAB — HM COLONOSCOPY

## 2023-02-28 ENCOUNTER — Encounter: Payer: Self-pay | Admitting: Family Medicine

## 2023-03-06 ENCOUNTER — Telehealth: Payer: Self-pay | Admitting: Neurology

## 2023-03-06 ENCOUNTER — Telehealth: Payer: Self-pay

## 2023-03-06 ENCOUNTER — Other Ambulatory Visit: Payer: Self-pay | Admitting: Neurology

## 2023-03-06 ENCOUNTER — Other Ambulatory Visit (HOSPITAL_COMMUNITY): Payer: Self-pay

## 2023-03-06 DIAGNOSIS — G35 Multiple sclerosis: Secondary | ICD-10-CM

## 2023-03-06 NOTE — Telephone Encounter (Signed)
sent to GI they obtain [employer] auth [id number] 

## 2023-03-06 NOTE — Telephone Encounter (Signed)
Pharmacy Patient Advocate Encounter   Received notification from Physician's Office that prior authorization for VUMERITY 231MG  delayed-release capsules is required/requested.   Insurance verification completed.   The patient is insured through [facility] .   Per test claim: PA required; PA submitted to [facility] via CoverMyMeds Key/confirmation #/EOC [id number] Status is pending

## 2023-03-06 NOTE — Telephone Encounter (Signed)
[facility] has called to report that a PA is needed for VUMERITY 231 MG CPDR . Rx benefit Plan [phone]

## 2023-03-06 NOTE — Telephone Encounter (Signed)
[name]- can you call pt and let her know MRI order placed by Dr. Epimenio Foot and explain next steps/get scheduled? Thank you

## 2023-03-06 NOTE — Telephone Encounter (Signed)
Pt is asking if she needs to have another MRI before the year is out, please call.

## 2023-03-06 NOTE — Telephone Encounter (Signed)
Pt last seen 08/16/22 and next f/u 09/06/23 (looks like she cx 03/08/23 appt d/t scheduling conflict).   Last MRI Brain 03/07/22: "MRI of the brain 03/07/2022 showed no new lesions.  Normal enhancement pattern.  No change compared to 02/09/2021.   MRI of the thoracic spine 03/07/2022 showed a normal spinal cord.  Some degenerative changes were noted in the lower thoracic spine."

## 2023-03-06 NOTE — Telephone Encounter (Signed)
PA request has been Submitted. New Encounter created for follow up. For additional info see Pharmacy Prior Auth telephone encounter from 03/06/2023.

## 2023-03-08 ENCOUNTER — Ambulatory Visit: Payer: Managed Care, Other (non HMO) | Admitting: Neurology

## 2023-03-09 ENCOUNTER — Other Ambulatory Visit (HOSPITAL_COMMUNITY): Payer: Self-pay

## 2023-03-09 NOTE — Telephone Encounter (Signed)
Pharmacy Patient Advocate Encounter  Received notification from [employer] that Prior Authorization for Vumerity DR 231MG  Capsule has been APPROVED from 03/08/2023 to 03/06/2024   PA #/Case ID/Reference #: [id number]

## 2023-03-12 ENCOUNTER — Encounter: Payer: Self-pay | Admitting: *Deleted

## 2023-03-12 NOTE — Telephone Encounter (Signed)
My chart message sent informing patient of approval,

## 2023-03-20 ENCOUNTER — Other Ambulatory Visit (HOSPITAL_COMMUNITY): Payer: Self-pay

## 2023-03-20 NOTE — Telephone Encounter (Signed)
[name] please see the below, have you received anything about denial?

## 2023-03-20 NOTE — Telephone Encounter (Signed)
[facility] PA was denied on 03/07/23 by the insurance.

## 2023-03-20 NOTE — Telephone Encounter (Signed)
I have not seen anything about a denial-the approval letter is in the chart under the media tab.

## 2023-03-22 ENCOUNTER — Other Ambulatory Visit: Payer: Managed Care, Other (non HMO)

## 2023-05-17 ENCOUNTER — Ambulatory Visit
Admission: RE | Admit: 2023-05-17 | Discharge: 2023-05-17 | Disposition: A | Discharge status: Home / Self Care | Payer: Managed Care, Other (non HMO) | Source: Ambulatory Visit | Attending: Neurology | Admitting: Neurology

## 2023-05-17 DIAGNOSIS — G35 Multiple sclerosis: Secondary | ICD-10-CM

## 2023-05-17 MED ORDER — GADOPICLENOL 0.5 MMOL/ML IV SOLN
5.5000 mL | Freq: Once | INTRAVENOUS | Status: AC | PRN
  Administered 2023-05-17: 5.5 mL via INTRAVENOUS

## 2023-05-25 ENCOUNTER — Encounter: Payer: Self-pay | Admitting: Family Medicine

## 2023-05-25 ENCOUNTER — Ambulatory Visit: Payer: Managed Care, Other (non HMO) | Admitting: Family Medicine

## 2023-05-25 VITALS — BP 98/60 | HR 82 | Temp 98.7°F | Wt 129.0 lb

## 2023-05-25 DIAGNOSIS — R059 Cough, unspecified: Secondary | ICD-10-CM

## 2023-05-25 DIAGNOSIS — J40 Bronchitis, not specified as acute or chronic: Secondary | ICD-10-CM

## 2023-05-25 LAB — POC COVID19 BINAXNOW: SARS Coronavirus 2 Ag: NEGATIVE

## 2023-05-25 MED ORDER — DOXYCYCLINE HYCLATE 100 MG PO TABS: 100.0000 mg | ORAL_TABLET | Freq: Two times a day (BID) | ORAL | 0 refills | Status: DC

## 2023-05-25 NOTE — Addendum Note (Signed)
Addended by: Carola Rhine on: 05/25/2023 05:25 PM   Modules accepted: Orders

## 2023-05-25 NOTE — Progress Notes (Signed)
   Subjective:    Patient ID: [name], female    DOB: 02-23-1971, 52 y.o.   MRN: [id number]  HPI Here for one week of chest congestion and a dry cough. No fever or SOB. Taking Mucinex DM.    Review of Systems  Constitutional: Negative.   HENT:  Negative for congestion, ear pain, postnasal drip, sinus pressure, sneezing and sore throat.   Eyes: Negative.   Respiratory:  Positive for cough. Negative for shortness of breath and wheezing.        Objective:   Physical Exam Constitutional:      Appearance: Normal appearance.  HENT:     Right Ear: Tympanic membrane, ear canal and external ear normal.     Left Ear: Tympanic membrane, ear canal and external ear normal.     Nose: Nose normal.     Mouth/Throat:     Pharynx: Oropharynx is clear.  Eyes:     Conjunctiva/sclera: Conjunctivae normal.  Pulmonary:     Effort: Pulmonary effort is normal.     Breath sounds: Normal breath sounds.  Lymphadenopathy:     Cervical: No cervical adenopathy.  Neurological:     Mental Status: She is alert.           Assessment & Plan:  Bronchitis, treat with 10 days of Doxycycline.  Gershon Crane, MD

## 2023-06-04 ENCOUNTER — Telehealth: Payer: Self-pay | Admitting: Neurology

## 2023-06-04 MED ORDER — VUMERITY 231 MG PO CPDR: DELAYED_RELEASE_CAPSULE | ORAL | 3 refills | Status: DC

## 2023-06-04 NOTE — Telephone Encounter (Signed)
 Refills sent. Pending f/u on 09/06/23.

## 2023-06-04 NOTE — Telephone Encounter (Signed)
 [name] with [facility] requesting refill of VUMERITY 231 MG CPDR Send to [facility] [place]

## 2023-07-10 ENCOUNTER — Encounter: Payer: Self-pay | Admitting: Neurology

## 2023-07-10 ENCOUNTER — Ambulatory Visit: Payer: Managed Care, Other (non HMO) | Admitting: Neurology

## 2023-07-10 VITALS — BP 109/63 | HR 78 | Ht 62.0 in | Wt 131.5 lb

## 2023-07-10 DIAGNOSIS — R200 Anesthesia of skin: Secondary | ICD-10-CM | POA: Diagnosis not present

## 2023-07-10 DIAGNOSIS — G35 Multiple sclerosis: Secondary | ICD-10-CM

## 2023-07-10 DIAGNOSIS — Z79899 Other long term (current) drug therapy: Secondary | ICD-10-CM | POA: Diagnosis not present

## 2023-07-10 DIAGNOSIS — G4700 Insomnia, unspecified: Secondary | ICD-10-CM | POA: Diagnosis not present

## 2023-07-10 NOTE — Progress Notes (Signed)
[facility]  PATIENT: [name] DOB: 21-Feb-1971  REFERRING DOCTOR OR PCP:  Gershon Crane SOURCE: patient, notes from Dr. Clent Ridges  _________________________________   HISTORICAL  CHIEF COMPLAINT:  Chief Complaint  Patient presents with   Room 10    Pt is here Alone. Pt states that she has been stable since her last appointment. Pt states that she doesn't have any new questions or concerns to discuss today.     HISTORY OF PRESENT ILLNESS:  [name] 53 y.o. woman diagnosed with relapsing remitting MS after her 06/10/2018 visit when she presented with numbness  Update 07/10/2023 She stopped the Vumerity due to hair thinning and also caused flushing.  Her MRi 04/2023 showed no Her MS is stable and she has no exacerbation.      It was unchanged compared to 2022.    CBC/D showed normal WBC/lymhocytes of 1.4 01/10/2022  She has no major new neurologic symptoms.  Te right leg is sometimes tingly.   Her gait and balance are doing well.      She has no problems with balance and goes up and down stairs well without the banister.   She has no weakness.  She has some right leg numbness and tingling at times, worse if she is sick  Bladder function is the same with urgency    Vision is doing well.  She uses readers some.   She denies much fatigue .  She denies depression.   Insomnia is variable.  If she sleeps poorly at night she may take a nap. .   She denies signififcant cognitive issues.     She has had dry eyes (positive test at ophth) and dry mouth over the last year and uses eye drops.     SSA/SSB were negative though ANA was 1:320 with a nucleolar pattern.  She was placed on dros but stopped.  Eyes are still sometimes dry.      She takes a vit D supplement and 12/2021 level was good at 68.9   MS history: She had the onset of numbness in both hands with a tingling sensation in mid November 2019.  She had a Lhermitte sign.  The tingling was uncomfortable at times  but not painful.    She also had numbness in her right foot and ankle.  MRI of the cervical spine showed a nonenhancing focus adjacent to C1-C2 and MRI of the brain showed about a dozen T2 hyperintense foci consistent with MS.  Medications: She started Vumerity February 2020:  Family history: Her brother has active secondary progressive MS, diagnosed with MS around 61 but symptomatic for many more years..   Imaging studies: MRI of the brain 05/17/2023 Scattered T2/FLAIR hyperintense foci in the cerebral hemispheres, and spinal cord in a pattern consistent with chronic demyelinating plaque associated with multiple sclerosis. The foci did not enhance or appear to be acute. Compared to the MRI from 03/07/2022, there were no new lesions.   MRI of the brain 03/07/2022 showed no new lesions.  Normal enhancement pattern.  No change compared to 02/09/2021.  MRI of the thoracic spine 03/07/2022 showed a normal spinal cord.  Some degenerative changes were noted in the lower thoracic spine.  MRI 02/09/2021 showed  Scattered T2/FLAIR hyperintense foci in the hemispheres and also a focus in the spinal cord in a pattern consistent with chronic demyelinating plaque associated with multiple sclerosis.  None of the foci appear to be acute.  They do not enhance.  Compared  to the MRI dated 01/13/2020, there are no new lesions.  MRI of the brain 01/13/2020 shows T2/FLAIR hyperintense foci predominantly in the periventricular white matter of the hemispheres.  The pattern and configuration is consistent with chronic demyelinating plaque associated with multiple sclerosis.  None of the foci enhances or appears to be acute.  They were all present and appear unchanged on the MRI from 01/15/2019.  MRI of the brain 01/15/2019 shows about a dozen T2/flair hyperintense foci in the hemispheres in the periventricular deep white matter.  These are consistent with chronic demyelinating plaque associated with multiple sclerosis.  MRI  of the cervical spine 06/12/2018 shows a nonenhancing T2 hyperintense focus in the posterior spinal cord adjacent to C2. This is consistent with a demyelinating plaque associated with multiple sclerosis.   There are mild multilevel degenerative changes as detailed above.  This causes mild spinal stenosis at C4-C5 and C5-C6.  There is no nerve root compression  Pertinent labs: On 06/18/2018, hep B and hep C serology was negative.  QuantiFERON-TB was negative.  HIV was negative.  Varicella-zoster IgG is positive (2259).  Cytochrome P450 2C9 is *1/*3.    REVIEW OF SYSTEMS: Constitutional: No fevers, chills, sweats, or change in appetite Eyes: No visual changes, double vision, eye pain Ear, nose and throat: No hearing loss, ear pain, nasal congestion, sore throat Cardiovascular: No chest pain, palpitations Respiratory:  No shortness of breath at rest or with exertion.   No wheezes GastrointestinaI: No nausea, vomiting, diarrhea, abdominal pain, fecal incontinence Genitourinary:  No dysuria, urinary retention or frequency.  No nocturia. Musculoskeletal:  No neck pain, back pain Integumentary: No rash, pruritus, skin lesions Neurological: as above Psychiatric: No depression at this time.  No anxiety Endocrine: No palpitations, diaphoresis, change in appetite, change in weigh or increased thirst Hematologic/Lymphatic:  No anemia, purpura, petechiae. Allergic/Immunologic: No itchy/runny eyes, nasal congestion, recent allergic reactions, rashes  ALLERGIES: Allergies  Allergen Reactions   Clarithromycin Rash   Erythromycin Base Rash   Moxifloxacin Swelling and Anxiety    lips swell    HOME MEDICATIONS:  Current Outpatient Medications:    Ascorbic Acid (VITAMIN C PO), Take by mouth., Disp: , Rfl:    B Complex Vitamins (VITAMIN-B COMPLEX) TABS, Take by mouth., Disp: , Rfl:    cetirizine (ZYRTEC) 10 MG tablet, Take 10 mg by mouth daily., Disp: , Rfl:    Cholecalciferol (VITAMIN D3) 1.25 MG (50000  UT) CAPS, TAKE 1 CAPSULE BY MOUTH ONE TIME PER WEEK, Disp: 12 capsule, Rfl: 3   ibuprofen (ADVIL) 200 MG tablet, Take 4 tablets (800 mg total) by mouth every 8 (eight) hours as needed. (Patient taking differently: Take 200 mg by mouth every 8 (eight) hours as needed.), Disp: 30 tablet, Rfl: 0   LO LOESTRIN FE 1 MG-10 MCG / 10 MCG tablet, Take 1 tablet by mouth daily., Disp: , Rfl:    Multiple Vitamins-Minerals (MULTIVITAMIN ADULT PO), Take by mouth., Disp: , Rfl:    Omega-3 Fatty Acids (OMEGA 3 PO), Take by mouth., Disp: , Rfl:    Vitamin D, Ergocalciferol, (DRISDOL) 1.25 MG (50000 UNIT) CAPS capsule, Take 1 capsule (50,000 Units total) by mouth every 7 (seven) days., Disp: 12 capsule, Rfl: 3   VUMERITY 231 MG CPDR, TAKE 2 CAPSULES BY MOUTH 2 TIMES A DAY., Disp: 120 capsule, Rfl: 3   Acetylcysteine (NAC PO), Take 1,000 mg by mouth daily. (Patient not taking: Reported on 07/10/2023), Disp: , Rfl:    doxycycline (VIBRA-TABS) 100 MG  tablet, Take 1 tablet (100 mg total) by mouth 2 (two) times daily. (Patient not taking: Reported on 07/10/2023), Disp: 20 tablet, Rfl: 0   omeprazole (PRILOSEC) 20 MG capsule, Take 1 capsule (20 mg total) by mouth daily., Disp: 45 capsule, Rfl: 0   TYRVAYA 0.03 MG/ACT SOLN, Place 1 spray into the nose 2 (two) times daily. (Patient not taking: Reported on 07/10/2023), Disp: , Rfl:   PAST MEDICAL HISTORY: Past Medical History:  Diagnosis Date   Actinic dermatitis    sees Dr. Charlton Haws    Allergy    Endometriosis    Low back pain    Multiple sclerosis [facility])    sees Dr. Despina Arias    Neuromuscular disorder [facility])    has MS    Palpitations    Routine gynecological examination    sees Dr. Billy Coast    Seasonal allergies     PAST SURGICAL HISTORY: Past Surgical History:  Procedure Laterality Date   dysplasia     cervical ablation   KNEE ARTHROSCOPY     LAPAROSCOPY N/A 12/12/2013   Procedure: LAPAROSCOPY DIAGNOSTIC;  Surgeon: Meriel Pica, MD;  Location: [facility];   Service: Gynecology;  Laterality: N/A;   lumbar ESI     Dr Jeral Fruit    FAMILY HISTORY: Family History  Problem Relation Age of Onset   Hyperthyroidism Mother        Christian Mate' disease Mother    Coronary artery disease Father    Cirrhosis Father    Multiple sclerosis Brother     SOCIAL HISTORY:  Social History   Socioeconomic History   Marital status: Married    Spouse name: Not on file   Number of children: 2   Years of education: BA   Highest education level: Not on file  Occupational History   Occupation: Works from home  Tobacco Use   Smoking status: Former   Smokeless tobacco: Never  Substance and Sexual Activity   Alcohol use: Yes    Alcohol/week: 0.0 standard drinks of alcohol    Comment: Has 1 drink per day   Drug use: No   Sexual activity: Not on file  Other Topics Concern   Not on file  Social History Narrative   Lives with husband   Caffeine use: 1-2 cups per day   Left handed    Social Drivers of Health   Financial Resource Strain: Not on file  Food Insecurity: Not on file  Transportation Needs: Not on file  Physical Activity: Not on file  Stress: Not on file  Social Connections: Unknown (10/18/2021)   [facility] from [facility], [facility]   Social Network    Social Network: Not on file  Intimate Partner Violence: Unknown (10/18/2021)   Received from [facility], [facility]   HITS    Physically Hurt: Not on file    Insult or Talk Down To: Not on file    Threaten Physical Harm: Not on file    Scream or Curse: Not on file     PHYSICAL EXAM  Vitals:   07/10/23 1547  BP: 109/63  Pulse: 78  Weight: 131 lb 8 oz (59.6 kg)  Height: 5\' 2"  (1.575 m)    Body mass index is 24.05 kg/m.   General: The patient is well-developed and well-nourished and in no acute distress.      Neurologic Exam  Mental status: The patient is alert and oriented x 3 at the time of the examination. The  patient has apparent normal recent and  remote memory, with an apparently normal attention span and concentration ability.   Speech is normal.  Cranial nerves: Eye movements are normal.  Facial strength is normal..   No obvious hearing deficits are noted.  Motor:  Muscle bulk is normal.   Tone is normal. Strength is  5 / 5 in all 4 extremities.   Sensory: Nohermitte sign.  Sensation was normal to vibration and touch in the legs.  Coordination: Cerebellar testing shows good finger-nose-finger and heel-to-shin bilaterally.  Gait and station: Station is normal.  Gait is normal.  Tandem gait is normal.   Romberg is negative.  Reflexes: Deep tendon reflexes are symmetric and normal, to the arms and 3 in the legs bilaterally.Marland Kitchen       DIAGNOSTIC DATA (LABS, IMAGING, TESTING) - I reviewed patient records, labs, notes, testing and imaging myself where available.  Lab Results  Component Value Date   WBC 5.8 08/16/2022   HGB 14.8 08/16/2022   HCT 44.0 08/16/2022   MCV 94 08/16/2022   PLT 161 08/16/2022      Component Value Date/Time   NA 137 01/10/2022 1108   NA 139 06/18/2018 1117   K 3.9 01/10/2022 1108   CL 105 01/10/2022 1108   CO2 24 01/10/2022 1108   GLUCOSE 84 01/10/2022 1108   BUN 16 01/10/2022 1108   BUN 11 06/18/2018 1117   CREATININE 0.66 01/10/2022 1108   CALCIUM 8.7 01/10/2022 1108   PROT 6.5 01/10/2022 1108   PROT 6.2 08/09/2021 0912   ALBUMIN 4.2 01/10/2022 1108   ALBUMIN 4.2 08/09/2021 0912   AST 16 01/10/2022 1108   ALT 10 01/10/2022 1108   ALKPHOS 25 (L) 01/10/2022 1108   BILITOT 0.4 01/10/2022 1108   BILITOT 0.3 08/09/2021 0912   GFRNONAA 93 06/18/2018 1117   GFRAA 107 06/18/2018 1117   Lab Results  Component Value Date   CHOL 147 01/10/2022   HDL 60.50 01/10/2022   LDLCALC 77 01/10/2022   TRIG 47.0 01/10/2022   CHOLHDL 2 01/10/2022   Lab Results  Component Value Date   HGBA1C 5.3 01/10/2022   Lab Results  Component Value Date   VITAMINB12 336 05/02/2018   Lab Results  Component  Value Date   TSH 1.19 01/10/2022       ASSESSMENT AND PLAN    1. Multiple sclerosis (HCC)   2. High risk medication use   3. Numbness   4. Insomnia, unspecified type       1.  Her MS is stable.  She will continue Vumerity for now.  We did discuss if she continues to have some difficulty with tolerability we could consider a different medication.  I went over [facility] as 1 option that allows control of the MS without medication constantly in the body..  We will check a CBC with differential today.   2.  Continue vitamin D.   3.   Stay active and exercise as possible 4.   Continue to exercise and eat a heart healthy diet  5.   rtc 6 months or sooner if new or worsening neurologic issues.   Ladale Sherburn A. Epimenio Foot, MD, PhD, FAAN Certified in Neurology, Clinical Neurophysiology, Sleep Medicine, Pain Medicine and Neuroimaging Director, [facility]  [facility] [address], [address] [place], [place] [address] [phone]

## 2023-07-11 ENCOUNTER — Encounter: Payer: Self-pay | Admitting: Neurology

## 2023-07-11 LAB — CBC WITH DIFFERENTIAL/PLATELET
Basophils Absolute: 0.1 10*3/uL (ref 0.0–0.2)
Basos: 1 %
EOS (ABSOLUTE): 0.1 10*3/uL (ref 0.0–0.4)
Eos: 1 %
Hematocrit: 43.4 % (ref 34.0–46.6)
Hemoglobin: 14.6 g/dL (ref 11.1–15.9)
Immature Grans (Abs): 0 10*3/uL (ref 0.0–0.1)
Immature Granulocytes: 0 %
Lymphocytes Absolute: 2 10*3/uL (ref 0.7–3.1)
Lymphs: 29 %
MCH: 31.8 pg (ref 26.6–33.0)
MCHC: 33.6 g/dL (ref 31.5–35.7)
MCV: 95 fL (ref 79–97)
Monocytes Absolute: 0.4 10*3/uL (ref 0.1–0.9)
Monocytes: 6 %
Neutrophils Absolute: 4.4 10*3/uL (ref 1.4–7.0)
Neutrophils: 63 %
Platelets: 202 10*3/uL (ref 150–450)
RBC: 4.59 x10E6/uL (ref 3.77–5.28)
RDW: 11.6 % — ABNORMAL LOW (ref 11.7–15.4)
WBC: 7.1 10*3/uL (ref 3.4–10.8)

## 2023-09-06 ENCOUNTER — Ambulatory Visit: Payer: Managed Care, Other (non HMO) | Admitting: Neurology

## 2024-01-11 ENCOUNTER — Other Ambulatory Visit: Payer: Self-pay | Admitting: Family Medicine

## 2024-01-11 DIAGNOSIS — E559 Vitamin D deficiency, unspecified: Secondary | ICD-10-CM

## 2024-01-24 ENCOUNTER — Encounter: Payer: Self-pay | Admitting: Neurology

## 2024-01-24 ENCOUNTER — Ambulatory Visit: Payer: Managed Care, Other (non HMO) | Admitting: Neurology

## 2024-01-24 VITALS — BP 111/74 | HR 68 | Ht 62.0 in | Wt 126.5 lb

## 2024-01-24 DIAGNOSIS — Z79899 Other long term (current) drug therapy: Secondary | ICD-10-CM

## 2024-01-24 DIAGNOSIS — R200 Anesthesia of skin: Secondary | ICD-10-CM

## 2024-01-24 DIAGNOSIS — E559 Vitamin D deficiency, unspecified: Secondary | ICD-10-CM | POA: Diagnosis not present

## 2024-01-24 DIAGNOSIS — G35 Multiple sclerosis: Secondary | ICD-10-CM

## 2024-01-24 DIAGNOSIS — R29818 Other symptoms and signs involving the nervous system: Secondary | ICD-10-CM

## 2024-01-24 DIAGNOSIS — G4700 Insomnia, unspecified: Secondary | ICD-10-CM

## 2024-01-24 NOTE — Progress Notes (Signed)
 [facility]  PATIENT: [name] DOB: Jun 13, 1970  REFERRING DOCTOR OR PCP:  Garnette Olmsted SOURCE: patient, notes from Dr. Olmsted  _________________________________   HISTORICAL  CHIEF COMPLAINT:  Chief Complaint  Patient presents with   Follow-up    Pt in room 11.alone. Here for MS follow up. Pt no longer on Vumerity  since Jan. No falls, last eye exam was last year.     HISTORY OF PRESENT ILLNESS:  [name] 53 y.o. woman diagnosed with relapsing remitting MS after her 06/10/2018 visit when she presented with numbness  Update 07/10/2023 She stopped the Vumerity  due to hair thinning and also caused flushing.  Last dose 06/2023.   Her MRi 04/2023 showed no Her MS is stable and she has no exacerbation.      It was unchanged compared to 2022.     We dicussed options.  I would be reluctant to have her stop DMT's altogether.  I recommend MAvenclad as the long effect may allow her to go without ever needing an additional DMT and it is generally well tolerated.    She has no major new neurologic symptoms.   Her gait and balance are doing well.      She has no problems with balance and goes up and down stairs well without the banister.   She has no weakness.  She has some right leg numbness and paresthesias at times but no pain.  No recent Lhermitte sign.   Bladder function is the same with urgency    Vision is doing well.  She uses readers some.   She denies much fatigue .  She denies depression.   Insomnia is variable.  If she sleeps poorly at night she may take a nap. .   She denies signififcant cognitive issues.     She has had dry eyes (positive test at ophth) and dry mouth over the last year and uses eye drops.     SSA/SSB were negative though ANA was 1:320 with a nucleolar pattern.  She was placed on dros but stopped.  Eyes are still sometimes dry.      She takes a vit D supplement and 12/2021 level was good at 68.9   MS history: She had the onset of  numbness in both hands with a tingling sensation in mid November 2019.  She had a Lhermitte sign.  The tingling was uncomfortable at times but not painful.    She also had numbness in her right foot and ankle.  MRI of the cervical spine showed a nonenhancing focus adjacent to C1-C2 and MRI of the brain showed about a dozen T2 hyperintense foci consistent with MS.  Medications: She started Vumerity  February 2020:  Family history: Her brother has active secondary progressive MS, diagnosed with MS around 93 but symptomatic for many more years..   Imaging studies: MRI of the brain 05/17/2023 Scattered T2/FLAIR hyperintense foci in the cerebral hemispheres, and spinal cord in a pattern consistent with chronic demyelinating plaque associated with multiple sclerosis. The foci did not enhance or appear to be acute. Compared to the MRI from 03/07/2022, there were no new lesions.   MRI of the brain 03/07/2022 showed no new lesions.  Normal enhancement pattern.  No change compared to 02/09/2021.  MRI of the thoracic spine 03/07/2022 showed a normal spinal cord.  Some degenerative changes were noted in the lower thoracic spine.  MRI 02/09/2021 showed  Scattered T2/FLAIR hyperintense foci in the hemispheres and also a focus in  the spinal cord in a pattern consistent with chronic demyelinating plaque associated with multiple sclerosis.  None of the foci appear to be acute.  They do not enhance.  Compared to the MRI dated 01/13/2020, there are no new lesions.  MRI of the brain 01/13/2020 shows T2/FLAIR hyperintense foci predominantly in the periventricular white matter of the hemispheres.  The pattern and configuration is consistent with chronic demyelinating plaque associated with multiple sclerosis.  None of the foci enhances or appears to be acute.  They were all present and appear unchanged on the MRI from 01/15/2019.  MRI of the brain 01/15/2019 shows about a dozen T2/flair hyperintense foci in the hemispheres  in the periventricular deep white matter.  These are consistent with chronic demyelinating plaque associated with multiple sclerosis.  MRI of the cervical spine 06/12/2018 shows a nonenhancing T2 hyperintense focus in the posterior spinal cord adjacent to C2. This is consistent with a demyelinating plaque associated with multiple sclerosis.   There are mild multilevel degenerative changes as detailed above.  This causes mild spinal stenosis at C4-C5 and C5-C6.  There is no nerve root compression  Pertinent labs: On 06/18/2018, hep B and hep C serology was negative.  QuantiFERON-TB was negative.  HIV was negative.  Varicella-zoster IgG is positive (2259).  Cytochrome P450 2C9 is *1/*3.    REVIEW OF SYSTEMS: Constitutional: No fevers, chills, sweats, or change in appetite Eyes: No visual changes, double vision, eye pain Ear, nose and throat: No hearing loss, ear pain, nasal congestion, sore throat Cardiovascular: No chest pain, palpitations Respiratory:  No shortness of breath at rest or with exertion.   No wheezes GastrointestinaI: No nausea, vomiting, diarrhea, abdominal pain, fecal incontinence Genitourinary:  No dysuria, urinary retention or frequency.  No nocturia. Musculoskeletal:  No neck pain, back pain Integumentary: No rash, pruritus, skin lesions Neurological: as above Psychiatric: No depression at this time.  No anxiety Endocrine: No palpitations, diaphoresis, change in appetite, change in weigh or increased thirst Hematologic/Lymphatic:  No anemia, purpura, petechiae. Allergic/Immunologic: No itchy/runny eyes, nasal congestion, recent allergic reactions, rashes  ALLERGIES: Allergies  Allergen Reactions   Clarithromycin Rash   Erythromycin Base Rash   Moxifloxacin Swelling and Anxiety    lips swell    HOME MEDICATIONS:  Current Outpatient Medications:    Acetylcysteine (NAC PO), Take 1,000 mg by mouth daily., Disp: , Rfl:    Ascorbic Acid (VITAMIN C PO), Take by mouth.,  Disp: , Rfl:    B Complex Vitamins (VITAMIN-B COMPLEX) TABS, Take by mouth., Disp: , Rfl:    cetirizine (ZYRTEC) 10 MG tablet, Take 10 mg by mouth daily., Disp: , Rfl:    Cholecalciferol (VITAMIN D3) 1.25 MG (50000 UT) CAPS, TAKE 1 CAPSULE BY MOUTH ONE TIME PER WEEK, Disp: 12 capsule, Rfl: 3   ibuprofen  (ADVIL ) 200 MG tablet, Take 4 tablets (800 mg total) by mouth every 8 (eight) hours as needed. (Patient taking differently: Take 200 mg by mouth every 8 (eight) hours as needed.), Disp: 30 tablet, Rfl: 0   LO LOESTRIN FE 1 MG-10 MCG / 10 MCG tablet, Take 1 tablet by mouth daily., Disp: , Rfl:    Multiple Vitamins-Minerals (MULTIVITAMIN ADULT PO), Take by mouth., Disp: , Rfl:    Omega-3 Fatty Acids (OMEGA 3 PO), Take by mouth., Disp: , Rfl:    omeprazole  (PRILOSEC) 20 MG capsule, Take 1 capsule (20 mg total) by mouth daily., Disp: 45 capsule, Rfl: 0   Vitamin D , Ergocalciferol , (DRISDOL ) 1.25 MG (50000  UNIT) CAPS capsule, Take 1 capsule (50,000 Units total) by mouth every 7 (seven) days., Disp: 12 capsule, Rfl: 3   VUMERITY  231 MG CPDR, TAKE 2 CAPSULES BY MOUTH 2 TIMES A DAY., Disp: 120 capsule, Rfl: 3  PAST MEDICAL HISTORY: Past Medical History:  Diagnosis Date   Actinic dermatitis    sees Dr. Junior    Allergy    Endometriosis    Low back pain    Multiple sclerosis (HCC)    sees Dr. Charlie Crete    Neuromuscular disorder [facility])    has MS    Palpitations    Routine gynecological examination    sees Dr. Gorge    Seasonal allergies     PAST SURGICAL HISTORY: Past Surgical History:  Procedure Laterality Date   dysplasia     cervical ablation   KNEE ARTHROSCOPY     LAPAROSCOPY N/A 12/12/2013   Procedure: LAPAROSCOPY DIAGNOSTIC;  Surgeon: Charlie CHRISTELLA Croak, MD;  Location: [facility];  Service: Gynecology;  Laterality: N/A;   lumbar ESI     Dr Leeann    FAMILY HISTORY: Family History  Problem Relation Age of Onset   Hyperthyroidism Mother        Yvone Yvone' disease Mother     Coronary artery disease Father    Cirrhosis Father    Multiple sclerosis Brother     SOCIAL HISTORY:  Social History   Socioeconomic History   Marital status: Married    Spouse name: Not on file   Number of children: 2   Years of education: BA   Highest education level: Not on file  Occupational History   Occupation: Works from home  Tobacco Use   Smoking status: Former   Smokeless tobacco: Never  Vaping Use   Vaping status: Never Used  Substance and Sexual Activity   Alcohol use: Yes    Alcohol/week: 1.0 standard drink of alcohol    Types: 1 Glasses of wine per week    Comment: Has 1 drink per day   Drug use: No   Sexual activity: Not on file  Other Topics Concern   Not on file  Social History Narrative   Lives with husband   Caffeine use: 1-2 cups per day   Left handed    Social Drivers of Health   Financial Resource Strain: Not on file  Food Insecurity: Not on file  Transportation Needs: Not on file  Physical Activity: Not on file  Stress: Not on file  Social Connections: Unknown (10/18/2021)   [facility]   Social Network    Social Network: Not on file  Intimate Partner Violence: Unknown (10/18/2021)   Received from [facility]   HITS    Physically Hurt: Not on file    Insult or Talk Down To: Not on file    Threaten Physical Harm: Not on file    Scream or Curse: Not on file     PHYSICAL EXAM  Vitals:   01/24/24 1135  BP: 111/74  Pulse: 68  Weight: 126 lb 8 oz (57.4 kg)  Height: 5' 2 (1.575 m)    Body mass index is 23.14 kg/m.   General: The patient is well-developed and well-nourished and in no acute distress.      Neurologic Exam  Mental status: The patient is alert and oriented x 3 at the time of the examination. The patient has apparent normal recent and remote memory, with an apparently normal attention span and concentration  ability.   Speech is normal.  Cranial nerves: Eye movements are normal.  Facial  strength is normal..   No obvious hearing deficits are noted.  Motor:  Muscle bulk is normal.   Tone is normal. Strength is  5 / 5 in all 4 extremities.   Sensory: Nohermitte sign.  Sensation was normal to vibration and touch in the legs.  Coordination: Cerebellar testing shows good finger-nose-finger and heel-to-shin bilaterally.  Gait and station: Station is normal.  Gait is normal.  Tandem gait is normal.   Romberg is negative.  Reflexes: Deep tendon reflexes are symmetric and normal, to the arms 3 at knees and anjles.  No ankle clonus.      DIAGNOSTIC DATA (LABS, IMAGING, TESTING) - I reviewed patient records, labs, notes, testing and imaging myself where available.  Lab Results  Component Value Date   WBC 7.1 07/10/2023   HGB 14.6 07/10/2023   HCT 43.4 07/10/2023   MCV 95 07/10/2023   PLT 202 07/10/2023      Component Value Date/Time   NA 137 01/10/2022 1108   NA 139 06/18/2018 1117   K 3.9 01/10/2022 1108   CL 105 01/10/2022 1108   CO2 24 01/10/2022 1108   GLUCOSE 84 01/10/2022 1108   BUN 16 01/10/2022 1108   BUN 11 06/18/2018 1117   CREATININE 0.66 01/10/2022 1108   CALCIUM 8.7 01/10/2022 1108   PROT 6.5 01/10/2022 1108   PROT 6.2 08/09/2021 0912   ALBUMIN 4.2 01/10/2022 1108   ALBUMIN 4.2 08/09/2021 0912   AST 16 01/10/2022 1108   ALT 10 01/10/2022 1108   ALKPHOS 25 (L) 01/10/2022 1108   BILITOT 0.4 01/10/2022 1108   BILITOT 0.3 08/09/2021 0912   GFRNONAA 93 06/18/2018 1117   GFRAA 107 06/18/2018 1117   Lab Results  Component Value Date   CHOL 147 01/10/2022   HDL 60.50 01/10/2022   LDLCALC 77 01/10/2022   TRIG 47.0 01/10/2022   CHOLHDL 2 01/10/2022   Lab Results  Component Value Date   HGBA1C 5.3 01/10/2022   Lab Results  Component Value Date   VITAMINB12 336 05/02/2018   Lab Results  Component Value Date   TSH 1.19 01/10/2022       ASSESSMENT AND PLAN    1. Multiple sclerosis (HCC)   2. High risk medication use   3. Numbness    4. Vitamin D  deficiency   5. Lhermitte's sign positive   6. Insomnia, unspecified type        1.  Her MS is stable.  She has a spinal cord plaque and I recommend a DMT with high efficacy.   We went over options for DMT's as VUmerity /Tecfidera poorly tolerated.    2.  Continue vitamin D .   3.   Stay active and exercise as possible 4.   Continue to exercise and eat a heart healthy diet  5.   rtc 6 months or sooner if new or worsening neurologic issues.   Edeline Greening A. Vear, MD, PhD, FAAN Certified in Neurology, Clinical Neurophysiology, Sleep Medicine, Pain Medicine and Neuroimaging Director, [facility]  [facility] [address] [place], [place] [address] [phone]

## 2024-01-28 ENCOUNTER — Encounter: Payer: Self-pay | Admitting: Neurology

## 2024-01-29 ENCOUNTER — Ambulatory Visit: Payer: Self-pay | Admitting: Neurology

## 2024-01-29 LAB — CBC WITH DIFFERENTIAL/PLATELET
Basophils Absolute: 0.1 x10E3/uL (ref 0.0–0.2)
Basos: 1 %
EOS (ABSOLUTE): 0.1 x10E3/uL (ref 0.0–0.4)
Eos: 1 %
Hematocrit: 48.3 % — ABNORMAL HIGH (ref 34.0–46.6)
Hemoglobin: 16.1 g/dL — ABNORMAL HIGH (ref 11.1–15.9)
Immature Grans (Abs): 0 x10E3/uL (ref 0.0–0.1)
Immature Granulocytes: 0 %
Lymphocytes Absolute: 1.9 x10E3/uL (ref 0.7–3.1)
Lymphs: 32 %
MCH: 31.6 pg (ref 26.6–33.0)
MCHC: 33.3 g/dL (ref 31.5–35.7)
MCV: 95 fL (ref 79–97)
Monocytes Absolute: 0.3 x10E3/uL (ref 0.1–0.9)
Monocytes: 6 %
Neutrophils Absolute: 3.7 x10E3/uL (ref 1.4–7.0)
Neutrophils: 60 %
Platelets: 183 x10E3/uL (ref 150–450)
RBC: 5.09 x10E6/uL (ref 3.77–5.28)
RDW: 11.7 % (ref 11.7–15.4)
WBC: 6 x10E3/uL (ref 3.4–10.8)

## 2024-01-29 LAB — HEPATIC FUNCTION PANEL
ALT: 19 IU/L (ref 0–32)
AST: 20 IU/L (ref 0–40)
Albumin: 4.3 g/dL (ref 3.8–4.9)
Alkaline Phosphatase: 32 IU/L — ABNORMAL LOW (ref 44–121)
Bilirubin Total: 0.3 mg/dL (ref 0.0–1.2)
Bilirubin, Direct: 0.09 mg/dL (ref 0.00–0.40)
Total Protein: 6.6 g/dL (ref 6.0–8.5)

## 2024-01-29 LAB — QUANTIFERON-TB GOLD PLUS
QuantiFERON Mitogen Value: 6.47 [IU]/mL
QuantiFERON Nil Value: 0.02 [IU]/mL
QuantiFERON TB1 Ag Value: 0.02 [IU]/mL
QuantiFERON TB2 Ag Value: 0.03 [IU]/mL

## 2024-01-29 LAB — HEPATITIS C ANTIBODY: Hep C Virus Ab: NONREACTIVE

## 2024-01-29 LAB — HIV ANTIBODY (ROUTINE TESTING W REFLEX): HIV Screen 4th Generation wRfx: NONREACTIVE

## 2024-01-29 LAB — HEPATITIS B CORE ANTIBODY, TOTAL: Hep B Core Total Ab: NEGATIVE

## 2024-01-29 LAB — HEPATITIS B SURFACE ANTIGEN: Hepatitis B Surface Ag: NEGATIVE

## 2024-01-29 NOTE — Progress Notes (Signed)
 Pt sent mychart asking to go back on Vumerity , forwarded to MD to review today. Waiting on response from MD

## 2024-01-29 NOTE — Telephone Encounter (Signed)
 Dr. Vic sent this result note:   Ok for her to be on Vumerity ?

## 2024-02-05 ENCOUNTER — Telehealth: Payer: Self-pay | Admitting: *Deleted

## 2024-02-05 ENCOUNTER — Other Ambulatory Visit: Payer: Self-pay | Admitting: *Deleted

## 2024-02-05 DIAGNOSIS — G35 Multiple sclerosis: Secondary | ICD-10-CM

## 2024-02-05 MED ORDER — VUMERITY 231 MG PO CPDR: DELAYED_RELEASE_CAPSULE | ORAL | 11 refills | Status: AC

## 2024-02-05 MED ORDER — VUMERITY 231 MG PO CPDR: DELAYED_RELEASE_CAPSULE | ORAL | 0 refills | Status: AC

## 2024-02-05 NOTE — Telephone Encounter (Signed)
 Electronically sent rx's to [facility] today. Pt starting back on Vumerity .

## 2024-02-11 NOTE — Telephone Encounter (Signed)
 Initiated PA for Vumerity  via CMM. Sent to [employer]. Waiting on clinical question set. Key: [id number].

## 2024-02-14 NOTE — Telephone Encounter (Signed)
 PA for Vumerity  has been approved by [place].  CaseId:[id number];Status:Approved;Review Type:Prior Auth;Coverage Start Date:01/15/2024;Coverage End Date:02/13/2025;

## 2024-02-25 NOTE — Telephone Encounter (Signed)
 I called patient.  She reports that she has received her Vumerity  and began taking it.  She is tolerating it well.  She will let us  know of questions or concerns prior to her appointment.

## 2024-06-02 ENCOUNTER — Ambulatory Visit: Admitting: Family Medicine

## 2024-06-30 ENCOUNTER — Ambulatory Visit: Payer: Self-pay

## 2024-09-01 ENCOUNTER — Ambulatory Visit: Admitting: Neurology
